# Patient Record
Sex: Female | Born: 1970 | Race: Black or African American | Hispanic: No | Marital: Single
Health system: Southern US, Community
[De-identification: ages and names within clinical notes are randomized; demographics above are authoritative.]

## PROBLEM LIST (undated history)

## (undated) DIAGNOSIS — R7881 Bacteremia: Secondary | ICD-10-CM

## (undated) DIAGNOSIS — M869 Osteomyelitis, unspecified: Secondary | ICD-10-CM

## (undated) DIAGNOSIS — G062 Extradural and subdural abscess, unspecified: Secondary | ICD-10-CM

## (undated) DIAGNOSIS — I959 Hypotension, unspecified: Secondary | ICD-10-CM

## (undated) DIAGNOSIS — F319 Bipolar disorder, unspecified: Secondary | ICD-10-CM

## (undated) DIAGNOSIS — L02212 Cutaneous abscess of back [any part, except buttock]: Secondary | ICD-10-CM

## (undated) DIAGNOSIS — Z930 Tracheostomy status: Secondary | ICD-10-CM

## (undated) DIAGNOSIS — G9341 Metabolic encephalopathy: Secondary | ICD-10-CM

## (undated) DIAGNOSIS — F419 Anxiety disorder, unspecified: Secondary | ICD-10-CM

## (undated) DIAGNOSIS — M4622 Osteomyelitis of vertebra, cervical region: Secondary | ICD-10-CM

## (undated) DIAGNOSIS — R0902 Hypoxemia: Secondary | ICD-10-CM

## (undated) DIAGNOSIS — I428 Other cardiomyopathies: Secondary | ICD-10-CM

## (undated) DIAGNOSIS — G8250 Quadriplegia, unspecified: Secondary | ICD-10-CM

## (undated) DIAGNOSIS — R1312 Dysphagia, oropharyngeal phase: Secondary | ICD-10-CM

## (undated) DIAGNOSIS — J9690 Respiratory failure, unspecified, unspecified whether with hypoxia or hypercapnia: Secondary | ICD-10-CM

---

## 2014-06-19 ENCOUNTER — Encounter (HOSPITAL_COMMUNITY): Payer: Self-pay | Admitting: Emergency Medicine

## 2014-06-19 ENCOUNTER — Inpatient Hospital Stay (HOSPITAL_COMMUNITY)
Admission: EM | Admit: 2014-06-19 | Discharge: 2014-07-26 | DRG: 870 | Disposition: A | Discharge status: Home Health | Payer: Medicare Other | Attending: Pulmonary Disease | Admitting: Pulmonary Disease

## 2014-06-19 ENCOUNTER — Emergency Department (HOSPITAL_COMMUNITY): Payer: Medicare Other

## 2014-06-19 DIAGNOSIS — A498 Other bacterial infections of unspecified site: Secondary | ICD-10-CM | POA: Insufficient documentation

## 2014-06-19 DIAGNOSIS — J9690 Respiratory failure, unspecified, unspecified whether with hypoxia or hypercapnia: Secondary | ICD-10-CM | POA: Insufficient documentation

## 2014-06-19 DIAGNOSIS — R739 Hyperglycemia, unspecified: Secondary | ICD-10-CM | POA: Diagnosis present

## 2014-06-19 DIAGNOSIS — R7881 Bacteremia: Secondary | ICD-10-CM | POA: Insufficient documentation

## 2014-06-19 DIAGNOSIS — Z930 Tracheostomy status: Secondary | ICD-10-CM

## 2014-06-19 DIAGNOSIS — Z6827 Body mass index (BMI) 27.0-27.9, adult: Secondary | ICD-10-CM

## 2014-06-19 DIAGNOSIS — D638 Anemia in other chronic diseases classified elsewhere: Secondary | ICD-10-CM | POA: Diagnosis present

## 2014-06-19 DIAGNOSIS — G9340 Encephalopathy, unspecified: Secondary | ICD-10-CM | POA: Diagnosis present

## 2014-06-19 DIAGNOSIS — I471 Supraventricular tachycardia: Secondary | ICD-10-CM | POA: Diagnosis present

## 2014-06-19 DIAGNOSIS — F419 Anxiety disorder, unspecified: Secondary | ICD-10-CM | POA: Diagnosis present

## 2014-06-19 DIAGNOSIS — I429 Cardiomyopathy, unspecified: Secondary | ICD-10-CM | POA: Diagnosis present

## 2014-06-19 DIAGNOSIS — J150 Pneumonia due to Klebsiella pneumoniae: Secondary | ICD-10-CM | POA: Diagnosis present

## 2014-06-19 DIAGNOSIS — J9600 Acute respiratory failure, unspecified whether with hypoxia or hypercapnia: Secondary | ICD-10-CM | POA: Insufficient documentation

## 2014-06-19 DIAGNOSIS — Y95 Nosocomial condition: Secondary | ICD-10-CM | POA: Diagnosis present

## 2014-06-19 DIAGNOSIS — A047 Enterocolitis due to Clostridium difficile: Secondary | ICD-10-CM | POA: Diagnosis not present

## 2014-06-19 DIAGNOSIS — E871 Hypo-osmolality and hyponatremia: Secondary | ICD-10-CM | POA: Diagnosis not present

## 2014-06-19 DIAGNOSIS — Z9911 Dependence on respirator [ventilator] status: Secondary | ICD-10-CM

## 2014-06-19 DIAGNOSIS — J9610 Chronic respiratory failure, unspecified whether with hypoxia or hypercapnia: Secondary | ICD-10-CM | POA: Insufficient documentation

## 2014-06-19 DIAGNOSIS — F319 Bipolar disorder, unspecified: Secondary | ICD-10-CM | POA: Diagnosis present

## 2014-06-19 DIAGNOSIS — N390 Urinary tract infection, site not specified: Secondary | ICD-10-CM | POA: Diagnosis present

## 2014-06-19 DIAGNOSIS — B952 Enterococcus as the cause of diseases classified elsewhere: Secondary | ICD-10-CM

## 2014-06-19 DIAGNOSIS — Z792 Long term (current) use of antibiotics: Secondary | ICD-10-CM

## 2014-06-19 DIAGNOSIS — E876 Hypokalemia: Secondary | ICD-10-CM | POA: Diagnosis not present

## 2014-06-19 DIAGNOSIS — J9620 Acute and chronic respiratory failure, unspecified whether with hypoxia or hypercapnia: Secondary | ICD-10-CM | POA: Diagnosis present

## 2014-06-19 DIAGNOSIS — M4622 Osteomyelitis of vertebra, cervical region: Secondary | ICD-10-CM | POA: Diagnosis present

## 2014-06-19 DIAGNOSIS — R509 Fever, unspecified: Secondary | ICD-10-CM | POA: Insufficient documentation

## 2014-06-19 DIAGNOSIS — R000 Tachycardia, unspecified: Secondary | ICD-10-CM | POA: Insufficient documentation

## 2014-06-19 DIAGNOSIS — G8250 Quadriplegia, unspecified: Secondary | ICD-10-CM | POA: Insufficient documentation

## 2014-06-19 DIAGNOSIS — J9612 Chronic respiratory failure with hypercapnia: Secondary | ICD-10-CM

## 2014-06-19 DIAGNOSIS — J9811 Atelectasis: Secondary | ICD-10-CM | POA: Insufficient documentation

## 2014-06-19 DIAGNOSIS — E46 Unspecified protein-calorie malnutrition: Secondary | ICD-10-CM | POA: Diagnosis present

## 2014-06-19 DIAGNOSIS — A4181 Sepsis due to Enterococcus: Secondary | ICD-10-CM | POA: Diagnosis present

## 2014-06-19 DIAGNOSIS — I5032 Chronic diastolic (congestive) heart failure: Secondary | ICD-10-CM | POA: Diagnosis present

## 2014-06-19 DIAGNOSIS — A419 Sepsis, unspecified organism: Secondary | ICD-10-CM | POA: Insufficient documentation

## 2014-06-19 DIAGNOSIS — B961 Klebsiella pneumoniae [K. pneumoniae] as the cause of diseases classified elsewhere: Secondary | ICD-10-CM | POA: Diagnosis present

## 2014-06-19 DIAGNOSIS — Z931 Gastrostomy status: Secondary | ICD-10-CM | POA: Diagnosis not present

## 2014-06-19 DIAGNOSIS — Z23 Encounter for immunization: Secondary | ICD-10-CM

## 2014-06-19 DIAGNOSIS — M4642 Discitis, unspecified, cervical region: Secondary | ICD-10-CM | POA: Diagnosis present

## 2014-06-19 DIAGNOSIS — A0472 Enterocolitis due to Clostridium difficile, not specified as recurrent: Secondary | ICD-10-CM | POA: Insufficient documentation

## 2014-06-19 DIAGNOSIS — R6520 Severe sepsis without septic shock: Secondary | ICD-10-CM | POA: Diagnosis present

## 2014-06-19 DIAGNOSIS — J189 Pneumonia, unspecified organism: Secondary | ICD-10-CM | POA: Insufficient documentation

## 2014-06-19 DIAGNOSIS — B370 Candidal stomatitis: Secondary | ICD-10-CM | POA: Diagnosis not present

## 2014-06-19 DIAGNOSIS — Z86718 Personal history of other venous thrombosis and embolism: Secondary | ICD-10-CM | POA: Diagnosis not present

## 2014-06-19 DIAGNOSIS — J1569 Pneumonia due to other gram-negative bacteria: Secondary | ICD-10-CM | POA: Insufficient documentation

## 2014-06-19 DIAGNOSIS — J156 Pneumonia due to other aerobic Gram-negative bacteria: Secondary | ICD-10-CM | POA: Insufficient documentation

## 2014-06-19 HISTORY — DX: Osteomyelitis of vertebra, cervical region: M46.22

## 2014-06-19 HISTORY — DX: Quadriplegia, unspecified: G82.50

## 2014-06-19 HISTORY — DX: Bipolar disorder, unspecified: F31.9

## 2014-06-19 HISTORY — DX: Extradural and subdural abscess, unspecified: G06.2

## 2014-06-19 HISTORY — DX: Dysphagia, oropharyngeal phase: R13.12

## 2014-06-19 HISTORY — DX: Hypoxemia: R09.02

## 2014-06-19 HISTORY — DX: Anxiety disorder, unspecified: F41.9

## 2014-06-19 HISTORY — DX: Respiratory failure, unspecified, unspecified whether with hypoxia or hypercapnia: J96.90

## 2014-06-19 HISTORY — DX: Osteomyelitis, unspecified: M86.9

## 2014-06-19 HISTORY — DX: Tracheostomy status: Z93.0

## 2014-06-19 HISTORY — DX: Hypotension, unspecified: I95.9

## 2014-06-19 HISTORY — DX: Other cardiomyopathies: I42.8

## 2014-06-19 HISTORY — DX: Bacteremia: R78.81

## 2014-06-19 HISTORY — DX: Cutaneous abscess of back (any part, except buttock): L02.212

## 2014-06-19 HISTORY — DX: Metabolic encephalopathy: G93.41

## 2014-06-19 LAB — COMPREHENSIVE METABOLIC PANEL
ALBUMIN: 2.8 g/dL — AB (ref 3.5–5.2)
ALT: 79 U/L — ABNORMAL HIGH (ref 0–35)
AST: 30 U/L (ref 0–37)
Alkaline Phosphatase: 96 U/L (ref 39–117)
Anion gap: 12 (ref 5–15)
BILIRUBIN TOTAL: 0.2 mg/dL — AB (ref 0.3–1.2)
BUN: 14 mg/dL (ref 6–23)
CALCIUM: 10.1 mg/dL (ref 8.4–10.5)
CHLORIDE: 90 meq/L — AB (ref 96–112)
CO2: 35 meq/L — AB (ref 19–32)
Creatinine, Ser: <0.2 mg/dL — ABNORMAL LOW (ref 0.50–1.10)
Glucose, Bld: 125 mg/dL — ABNORMAL HIGH (ref 70–99)
Potassium: 4.7 mEq/L (ref 3.7–5.3)
SODIUM: 137 meq/L (ref 137–147)
Total Protein: 7.7 g/dL (ref 6.0–8.3)

## 2014-06-19 LAB — CBC
HCT: 32.8 % — ABNORMAL LOW (ref 36.0–46.0)
Hemoglobin: 10.1 g/dL — ABNORMAL LOW (ref 12.0–15.0)
MCH: 30.1 pg (ref 26.0–34.0)
MCHC: 30.8 g/dL (ref 30.0–36.0)
MCV: 97.6 fL (ref 78.0–100.0)
PLATELETS: 309 10*3/uL (ref 150–400)
RBC: 3.36 MIL/uL — ABNORMAL LOW (ref 3.87–5.11)
RDW: 15 % (ref 11.5–15.5)
WBC: 15.2 10*3/uL — ABNORMAL HIGH (ref 4.0–10.5)

## 2014-06-19 LAB — URINALYSIS, ROUTINE W REFLEX MICROSCOPIC
BILIRUBIN URINE: NEGATIVE
GLUCOSE, UA: NEGATIVE mg/dL
Ketones, ur: 15 mg/dL — AB
Nitrite: POSITIVE — AB
PH: 7.5 (ref 5.0–8.0)
Protein, ur: >300 mg/dL — AB
Specific Gravity, Urine: 1.023 (ref 1.005–1.030)
UROBILINOGEN UA: 1 mg/dL (ref 0.0–1.0)

## 2014-06-19 LAB — URINE MICROSCOPIC-ADD ON

## 2014-06-19 LAB — CBC WITH DIFFERENTIAL/PLATELET
Basophils Absolute: 0 10*3/uL (ref 0.0–0.1)
Basophils Relative: 0 % (ref 0–1)
EOS PCT: 0 % (ref 0–5)
Eosinophils Absolute: 0.1 10*3/uL (ref 0.0–0.7)
HEMATOCRIT: 37.8 % (ref 36.0–46.0)
Hemoglobin: 11.6 g/dL — ABNORMAL LOW (ref 12.0–15.0)
LYMPHS ABS: 1.1 10*3/uL (ref 0.7–4.0)
LYMPHS PCT: 7 % — AB (ref 12–46)
MCH: 30.2 pg (ref 26.0–34.0)
MCHC: 30.7 g/dL (ref 30.0–36.0)
MCV: 98.4 fL (ref 78.0–100.0)
MONO ABS: 1.5 10*3/uL — AB (ref 0.1–1.0)
Monocytes Relative: 10 % (ref 3–12)
Neutro Abs: 12.7 10*3/uL — ABNORMAL HIGH (ref 1.7–7.7)
Neutrophils Relative %: 83 % — ABNORMAL HIGH (ref 43–77)
Platelets: 331 10*3/uL (ref 150–400)
RBC: 3.84 MIL/uL — AB (ref 3.87–5.11)
RDW: 15.1 % (ref 11.5–15.5)
WBC: 15.3 10*3/uL — AB (ref 4.0–10.5)

## 2014-06-19 LAB — CREATININE, SERUM: Creatinine, Ser: <0.2 mg/dL — ABNORMAL LOW (ref 0.50–1.10)

## 2014-06-19 LAB — I-STAT CG4 LACTIC ACID, ED: LACTIC ACID, VENOUS: 1.24 mmol/L (ref 0.5–2.2)

## 2014-06-19 LAB — TSH: TSH: 4.71 u[IU]/mL — AB (ref 0.350–4.500)

## 2014-06-19 LAB — I-STAT ARTERIAL BLOOD GAS, ED
Acid-Base Excess: 10 mmol/L — ABNORMAL HIGH (ref 0.0–2.0)
BICARBONATE: 37.1 meq/L — AB (ref 20.0–24.0)
O2 SAT: 95 %
PO2 ART: 80 mmHg (ref 80.0–100.0)
TCO2: 39 mmol/L (ref 0–100)
pCO2 arterial: 60.2 mmHg (ref 35.0–45.0)
pH, Arterial: 7.398 (ref 7.350–7.450)

## 2014-06-19 MED ORDER — SODIUM CHLORIDE 0.9 % IV BOLUS (SEPSIS)
1000.0000 mL | Freq: Once | INTRAVENOUS | Status: AC
  Administered 2014-06-19: 1000 mL via INTRAVENOUS

## 2014-06-19 MED ORDER — SODIUM CHLORIDE 0.9 % IV BOLUS (SEPSIS)
500.0000 mL | Freq: Once | INTRAVENOUS | Status: AC
  Administered 2014-06-19: 500 mL via INTRAVENOUS

## 2014-06-19 MED ORDER — FLUCONAZOLE IN SODIUM CHLORIDE 200-0.9 MG/100ML-% IV SOLN
200.0000 mg | INTRAVENOUS | Status: DC
  Administered 2014-06-20: 200 mg via INTRAVENOUS
  Filled 2014-06-19: qty 100

## 2014-06-19 MED ORDER — HEPARIN SODIUM (PORCINE) 5000 UNIT/ML IJ SOLN
5000.0000 [IU] | Freq: Three times a day (TID) | INTRAMUSCULAR | Status: DC
  Administered 2014-06-20 – 2014-07-14 (×74): 5000 [IU] via SUBCUTANEOUS
  Filled 2014-06-19 (×77): qty 1

## 2014-06-19 MED ORDER — SODIUM CHLORIDE 0.9 % IV SOLN
250.0000 mL | INTRAVENOUS | Status: DC | PRN
  Administered 2014-06-25: 250 mL via INTRAVENOUS
  Administered 2014-07-19: 500 mL via INTRAVENOUS

## 2014-06-19 MED ORDER — FAMOTIDINE IN NACL 20-0.9 MG/50ML-% IV SOLN
20.0000 mg | Freq: Two times a day (BID) | INTRAVENOUS | Status: DC
  Administered 2014-06-20 (×2): 20 mg via INTRAVENOUS
  Filled 2014-06-19 (×3): qty 50

## 2014-06-19 MED ORDER — ACETAMINOPHEN 160 MG/5ML PO SOLN
650.0000 mg | Freq: Once | ORAL | Status: AC
  Administered 2014-06-19: 650 mg
  Filled 2014-06-19: qty 20.3

## 2014-06-19 MED ORDER — SODIUM CHLORIDE 0.9 % IV SOLN
INTRAVENOUS | Status: DC
  Administered 2014-06-20 (×2): via INTRAVENOUS

## 2014-06-19 NOTE — ED Notes (Signed)
Admitting MD at bedside.

## 2014-06-19 NOTE — ED Notes (Signed)
Pt sent to ED [facility].  Ref. Tachycardia .  [facility] pt was transported to ED at pt's husband request ref fast heartbeat.  Staff at [facility] reported that pt always has a rapid heartbeat and has elevated temp off and on.  Pt is non-verbal and is a quad
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
## 2014-06-19 NOTE — ED Notes (Signed)
Critical Care PA in to assess pt for admission

## 2014-06-19 NOTE — ED Notes (Signed)
Pt's husband at bedside.  Resp. Tech called to suction pt.

## 2014-06-20 ENCOUNTER — Inpatient Hospital Stay (HOSPITAL_COMMUNITY): Payer: Medicare Other

## 2014-06-20 DIAGNOSIS — Z930 Tracheostomy status: Secondary | ICD-10-CM

## 2014-06-20 DIAGNOSIS — I471 Supraventricular tachycardia: Secondary | ICD-10-CM

## 2014-06-20 DIAGNOSIS — J9610 Chronic respiratory failure, unspecified whether with hypoxia or hypercapnia: Secondary | ICD-10-CM

## 2014-06-20 DIAGNOSIS — J189 Pneumonia, unspecified organism: Secondary | ICD-10-CM

## 2014-06-20 DIAGNOSIS — N390 Urinary tract infection, site not specified: Secondary | ICD-10-CM

## 2014-06-20 LAB — GLUCOSE, CAPILLARY
GLUCOSE-CAPILLARY: 118 mg/dL — AB (ref 70–99)
Glucose-Capillary: 99 mg/dL (ref 70–99)
Glucose-Capillary: 111 mg/dL — ABNORMAL HIGH (ref 70–99)
Glucose-Capillary: 94 mg/dL (ref 70–99)

## 2014-06-20 LAB — BASIC METABOLIC PANEL
Anion gap: 6 (ref 5–15)
BUN: 8 mg/dL (ref 6–23)
CO2: 31 mmol/L (ref 19–32)
Calcium: 8.9 mg/dL (ref 8.4–10.5)
Chloride: 98 mEq/L (ref 96–112)
Glucose, Bld: 106 mg/dL — ABNORMAL HIGH (ref 70–99)
Potassium: 4 mmol/L (ref 3.5–5.1)
Sodium: 135 mmol/L (ref 135–145)

## 2014-06-20 LAB — CBC
HCT: 31.7 % — ABNORMAL LOW (ref 36.0–46.0)
Hemoglobin: 9.7 g/dL — ABNORMAL LOW (ref 12.0–15.0)
MCH: 29.7 pg (ref 26.0–34.0)
MCHC: 30.6 g/dL (ref 30.0–36.0)
MCV: 96.9 fL (ref 78.0–100.0)
Platelets: 273 10*3/uL (ref 150–400)
RBC: 3.27 MIL/uL — ABNORMAL LOW (ref 3.87–5.11)
RDW: 15 % (ref 11.5–15.5)
WBC: 13.8 10*3/uL — ABNORMAL HIGH (ref 4.0–10.5)

## 2014-06-20 LAB — PHOSPHORUS: Phosphorus: 3.8 mg/dL (ref 2.3–4.6)

## 2014-06-20 LAB — MAGNESIUM: Magnesium: 1.6 mg/dL (ref 1.5–2.5)

## 2014-06-20 LAB — MRSA PCR SCREENING: MRSA by PCR: NEGATIVE

## 2014-06-20 MED ORDER — CHLORHEXIDINE GLUCONATE 0.12 % MT SOLN
15.0000 mL | Freq: Two times a day (BID) | OROMUCOSAL | Status: DC
  Administered 2014-06-20 – 2014-07-26 (×75): 15 mL via OROMUCOSAL
  Filled 2014-06-20 (×74): qty 15

## 2014-06-20 MED ORDER — MIDODRINE HCL 5 MG PO TABS
10.0000 mg | ORAL_TABLET | Freq: Three times a day (TID) | ORAL | Status: DC
  Administered 2014-06-20 – 2014-07-07 (×52): 10 mg
  Filled 2014-06-20 (×57): qty 2

## 2014-06-20 MED ORDER — VITAL HIGH PROTEIN PO LIQD
1000.0000 mL | ORAL | Status: DC
  Filled 2014-06-20 (×3): qty 1000

## 2014-06-20 MED ORDER — PRO-STAT SUGAR FREE PO LIQD
30.0000 mL | Freq: Two times a day (BID) | ORAL | Status: AC
  Administered 2014-06-20 (×2): 30 mL
  Filled 2014-06-20 (×2): qty 30

## 2014-06-20 MED ORDER — FREE WATER
100.0000 mL | Freq: Three times a day (TID) | Status: DC
  Administered 2014-06-20 – 2014-06-21 (×4): 100 mL

## 2014-06-20 MED ORDER — FAMOTIDINE 40 MG/5ML PO SUSR
20.0000 mg | Freq: Two times a day (BID) | ORAL | Status: DC
  Administered 2014-06-20 – 2014-07-04 (×29): 20 mg
  Filled 2014-06-20 (×30): qty 2.5

## 2014-06-20 MED ORDER — ACETAMINOPHEN 160 MG/5ML PO SOLN
650.0000 mg | Freq: Once | ORAL | Status: AC
  Administered 2014-06-20: 650 mg
  Filled 2014-06-20: qty 20.3

## 2014-06-20 MED ORDER — VITAL AF 1.2 CAL PO LIQD
1000.0000 mL | ORAL | Status: DC
  Administered 2014-06-20 – 2014-07-03 (×14): 1000 mL
  Filled 2014-06-20 (×22): qty 1000

## 2014-06-20 MED ORDER — CIPROFLOXACIN IN D5W 400 MG/200ML IV SOLN
400.0000 mg | Freq: Two times a day (BID) | INTRAVENOUS | Status: DC
  Administered 2014-06-20 – 2014-06-23 (×7): 400 mg via INTRAVENOUS
  Filled 2014-06-20 (×8): qty 200

## 2014-06-20 MED ORDER — METOPROLOL TARTRATE 25 MG PO TABS
25.0000 mg | ORAL_TABLET | Freq: Three times a day (TID) | ORAL | Status: DC
  Administered 2014-06-20 – 2014-07-07 (×46): 25 mg
  Filled 2014-06-20 (×54): qty 1

## 2014-06-20 MED ORDER — INSULIN ASPART 100 UNIT/ML ~~LOC~~ SOLN: 0.0000 [IU] | SUBCUTANEOUS | Status: DC

## 2014-06-20 MED ORDER — DICLOXACILLIN SODIUM 500 MG PO CAPS
500.0000 mg | ORAL_CAPSULE | Freq: Three times a day (TID) | ORAL | Status: DC
  Administered 2014-06-20 – 2014-07-26 (×111): 500 mg
  Filled 2014-06-20 (×113): qty 1

## 2014-06-20 MED ORDER — CETYLPYRIDINIUM CHLORIDE 0.05 % MT LIQD
7.0000 mL | Freq: Four times a day (QID) | OROMUCOSAL | Status: DC
  Administered 2014-06-20 – 2014-07-26 (×147): 7 mL via OROMUCOSAL

## 2014-06-20 MED ORDER — FLUCONAZOLE 40 MG/ML PO SUSR
200.0000 mg | Freq: Every day | ORAL | Status: DC
Start: 2014-06-20 — End: 2014-06-21
  Administered 2014-06-20 – 2014-06-21 (×2): 200 mg
  Filled 2014-06-20 (×2): qty 5

## 2014-06-20 MED ORDER — METOPROLOL TARTRATE 1 MG/ML IV SOLN
2.5000 mg | INTRAVENOUS | Status: DC | PRN
  Administered 2014-06-20 – 2014-06-22 (×2): 5 mg via INTRAVENOUS
  Administered 2014-06-25 – 2014-06-26 (×3): 2.5 mg via INTRAVENOUS
  Administered 2014-06-26: 5 mg via INTRAVENOUS
  Administered 2014-06-30 – 2014-07-05 (×2): 2.5 mg via INTRAVENOUS
  Administered 2014-07-06: 5 mg via INTRAVENOUS
  Administered 2014-07-06 – 2014-07-07 (×3): 2.5 mg via INTRAVENOUS
  Filled 2014-06-20 (×12): qty 5

## 2014-06-20 NOTE — Progress Notes (Signed)
ANTIBIOTIC CONSULT NOTE - INITIAL  Pharmacy Consult:  Cipro Indication:  Rule out UTI  Allergies  Allergen Reactions  . Tape Rash    Patient Measurements: Weight: 138 lb 10.7 oz (62.9 kg)  Vital Signs: Temp: 100.7 F (38.2 C) (12/22 0814) Temp Source: Oral (12/22 0814) BP: 118/61 mmHg (12/22 0840) Pulse Rate: 128 (12/22 0840) Intake/Output from previous day: 12/21 0701 - 12/22 0700 In: 706.7 [I.V.:496.7; IV Piggyback:150] Out: 550 [Urine:550] Intake/Output from this shift: Total I/O In: 170 [I.V.:110; Other:60] Out: 75 [Urine:75]  Labs:  Recent Labs  06/19/14 2014 06/19/14 2225 06/20/14 0330  WBC 15.3* 15.2* 13.8*  HGB 11.6* 10.1* 9.7*  PLT 331 309 273  CREATININE <0.20* <0.20* <0.30*   CrCl cannot be calculated (Unknown ideal weight.). No results for input(s): VANCOTROUGH, VANCOPEAK, VANCORANDOM, GENTTROUGH, GENTPEAK, GENTRANDOM, TOBRATROUGH, TOBRAPEAK, TOBRARND, AMIKACINPEAK, AMIKACINTROU, AMIKACIN in the last 72 hours.   Microbiology: Recent Results (from the past 720 hour(s))  MRSA PCR Screening     Status: None   Collection Time: 06/20/14 12:20 AM  Result Value Ref Range Status   MRSA by PCR NEGATIVE NEGATIVE Final    Comment:        The GeneXpert MRSA Assay (FDA approved for NASAL specimens only), is one component of a comprehensive MRSA colonization surveillance program. It is not intended to diagnose MRSA infection nor to guide or monitor treatment for MRSA infections.     Medical History: Past Medical History  Diagnosis Date  . Osteomyelitis   . Epidural abscess   . Respiratory failure   . Metabolic encephalopathy   . Nonischemic cardiomyopathy   . Hypotension   . Osteomyelitis of vertebra of cervical region   . Bipolar 1 disorder   . Anxiety disorder   . Hypoxemia   . Bacteremia   . Hypoxic ischemic encephalopathy (HIE)   . Cutaneous abscess of back excluding buttocks   . Quadriplegia   . Dysphagia, oropharyngeal phase   .  Tracheostomy in place      Assessment: [age] YOF admitted from [facility] for evaluation of tachycardia.  She has a complicated infectious history and is continued on dicloxacillin from PTA for history of cervical hardware/osteomyelitis.  Pharmacy consulted to initiate Cipro for UTI.  Order was missed.  Patient is quadriplegic; therefore, her SCr is likely falsely low.  Cipro 12/22 >> Dicloxacillin PTA 12/15 >> 08/14/14 Fluc 12/22 >>  12/22 TA cx - 12/21 BCx x2 -  12/21 UCx -    Goal of Therapy:  Clearance of infection   Plan:  - Cipro 400mg  IV Q12H - Diflucan 200mg  IV Q24H and dicloxacillin 500mg  PT TID per MD - Monitor renal fxn, clinical progress - Consider checking free T3/T4   Cory Kitt D. Laney Potashang, PharmD, BCPS Pager:  [phone] 06/20/2014, 9:18 AM

## 2014-06-20 NOTE — H&P (Signed)
PULMONARY / CRITICAL CARE MEDICINE   Name: Kathryn Booth MRN: [id number] DOB: [birth year]    ADMISSION DATE:  06/19/2014 CONSULTATION DATE:  06/20/2014  REFERRING MD :  [facility]  CHIEF COMPLAINT:  Tachycardia  INITIAL PRESENTATION:  43 y.o. 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  Recently admitted to [facility] (06/07/14).  Has had persistent tachycardia while at [facility].  [facility] after husbands request for further evaluation of tachycardia.  In ED, UA suggestive of UTI.   STUDIES:  None  SIGNIFICANT EVENTS: 04/10/14 - debridement of cervical abscess [facility] 06/07/14 - admitted to [facility] 12/21 - [facility]   HISTORY OF PRESENT ILLNESS:  Pt is encephalopathic; therefore, this HPI is obtained from chart review. Kathryn Booth is a 43 y.o. F with PMH as outlined below.  [facility] with complaints of neck pain and was found to have cervical abscess for which she underwent debridement on 10/15.  She is now quadriplegic secondary to her cervical abscess.  Her hospital course was complicated by development of shock and multiple organ failure requiring pressors.  [facility] for attempts at weaning from ventilator; however, she was unable to do so secondary to quadriplegia.  She subsequently required trach and PEG She was admitted to [facility] on 06/07/14 and per report, had been tachycardic since then.  On 12/21, pt's [facility] for further evaluation of tachycardia.  Of note, pt is on dicloxacillin 500mg  TID until 08/14/14 for cervical hardware.   PAST MEDICAL HISTORY :   has a past medical history of Osteomyelitis; Epidural abscess; Respiratory failure; Metabolic encephalopathy; Nonischemic cardiomyopathy; Hypotension; Osteomyelitis of vertebra of cervical region; Bipolar 1 disorder; Anxiety disorder; Hypoxemia; Bacteremia; Hypoxic ischemic encephalopathy  (HIE); Cutaneous abscess of back excluding buttocks; Quadriplegia; Dysphagia, oropharyngeal phase; and Tracheostomy in place.  has no past surgical history on file. Prior to Admission medications   Medication Sig Start Date End Date Taking? Authorizing Provider  acetaminophen (TYLENOL) 325 MG tablet 650 mg by PEG Tube route every 6 (six) hours as needed for mild pain.   Yes Historical Provider, MD  chlorhexidine (PERIDEX) 0.12 % solution Use as directed 15 mLs in the mouth or throat 2 (two) times daily.   Yes Historical Provider, MD  dicloxacillin (DYNAPEN) 500 MG capsule 500 mg by PEG Tube route 2 (two) times daily.   Yes Historical Provider, MD  dicloxacillin (DYNAPEN) 500 MG capsule Take 500 mg by mouth 3 (three) times daily.   Yes Historical Provider, MD  diltiazem (CARDIZEM) 30 MG tablet Place 30 mg into feeding tube every 6 (six) hours as needed (> 120).   Yes Historical Provider, MD  diltiazem (CARDIZEM) 30 MG tablet 30 mg by PEG Tube route daily.   Yes Historical Provider, MD  famotidine (PEPCID) 20 MG tablet 20 mg by PEG Tube route at bedtime.   Yes Historical Provider, MD  fentaNYL (DURAGESIC - DOSED MCG/HR) 25 MCG/HR patch Place 25 mcg onto the skin every 3 (three) days.   Yes Historical Provider, MD  heparin 5000 UNIT/ML injection Inject 5,000 Units into the skin every 8 (eight) hours.   Yes Historical Provider, MD  insulin lispro (HUMALOG) 100 UNIT/ML injection Inject 0-8 Units into the skin 3 (three) times daily before meals. Patient uses sliding scale   Yes Historical Provider, MD  metoprolol tartrate (LOPRESSOR) 25 MG tablet 25 mg by  PEG Tube route every 8 (eight) hours.   Yes Historical Provider, MD  midodrine (PROAMATINE) 10 MG tablet 10 mg by PEG Tube route 3 (three) times daily.   Yes Historical Provider, MD  Nutritional Supplements (ISOSOURCE 1.5 CAL PO) 300 mLs by PEG Tube route every 6 (six) hours.   Yes Historical Provider, MD  polyvinyl alcohol (LIQUIFILM TEARS) 1.4 %  ophthalmic solution Place 1 drop into both eyes as needed for dry eyes.   Yes Historical Provider, MD  potassium chloride (KLOR-CON) 20 MEQ packet 20 mEq by PEG Tube route daily.   Yes Historical Provider, MD  senna (SENOKOT) 8.6 MG tablet 2 tablets by PEG Tube route daily.   Yes Historical Provider, MD   Allergies  Allergen Reactions  . Tape Rash    FAMILY HISTORY:  No family history on file.  SOCIAL HISTORY:  reports that she does not drink alcohol or use illicit drugs. Her tobacco history is not on file.  REVIEW OF SYSTEMS:  Unable to obtain as pt is non-verbal.  SUBJECTIVE:   VITAL SIGNS: Temp:  [99.5 F (37.5 C)] 99.5 F (37.5 C) (12/22 0033) Pulse Rate:  [129-142] 132 (12/22 0130) Resp:  [16-30] 24 (12/22 0135) BP: (83-116)/(42-71) 116/71 mmHg (12/22 0135) SpO2:  [95 %-100 %] 99 % (12/22 0135) FiO2 (%):  [30 %] 30 % (12/22 0135) Weight:  [62.9 kg (138 lb 10.7 oz)] 62.9 kg (138 lb 10.7 oz) (12/22 0113) HEMODYNAMICS:   VENTILATOR SETTINGS: Vent Mode:  [-] SIMV;PSV FiO2 (%):  [30 %] 30 % Set Rate:  [12 bmp] 12 bmp Vt Set:  [450 mL] 450 mL PEEP:  [5 cmH20] 5 cmH20 Pressure Support:  [10 cmH20] 10 cmH20 Plateau Pressure:  [26 cmH20-27 cmH20] 26 cmH20 INTAKE / OUTPUT: Intake/Output      12/21 0701 - 12/22 0700   IV Piggyback 100   Total Intake(mL/kg) 100 (1.6)   Net +100         PHYSICAL EXAMINATION: General: WDWN female, in NAD. Neuro: Non-verbal at baseline.  Opens eyes and shakes head. HEENT: [place]/AT. PERRL, sclerae anicteric. Cardiovascular: Tachycardic, regular, no M/R/G.  Lungs: Respirations even and unlabored.  CTA bilaterally, No W/R/R.  Abdomen: BS x 4, soft, NT/ND.  Musculoskeletal: No gross deformities, no edema.  Skin: Intact, warm, no rashes.  LABS:  CBC  Recent Labs Lab 06/19/14 2014 06/19/14 2225  WBC 15.3* 15.2*  HGB 11.6* 10.1*  HCT 37.8 32.8*  PLT 331 309   Coag's No results for input(s): APTT, INR in the last 168  hours. BMET  Recent Labs Lab 06/19/14 2014 06/19/14 2225  NA 137  --   K 4.7  --   CL 90*  --   CO2 35*  --   BUN 14  --   CREATININE <0.20* <0.20*  GLUCOSE 125*  --    Electrolytes  Recent Labs Lab 06/19/14 2014  CALCIUM 10.1   Sepsis Markers  Recent Labs Lab 06/19/14 2022  LATICACIDVEN 1.24   ABG  Recent Labs Lab 06/19/14 2035  PHART 7.398  PCO2ART 60.2*  PO2ART 80.0   Liver Enzymes  Recent Labs Lab 06/19/14 2014  AST 30  ALT 79*  ALKPHOS 96  BILITOT 0.2*  ALBUMIN 2.8*   Cardiac Enzymes No results for input(s): TROPONINI, PROBNP in the last 168 hours. Glucose  Recent Labs Lab 06/20/14 0034  GLUCAP 118*    Imaging Dg Chest Port 1 View  06/19/2014   CLINICAL DATA:  Fever. History  of spinal cord abscess last year. Initial encounter.  EXAM: PORTABLE CHEST - 1 VIEW  COMPARISON:  None.  FINDINGS: 1950 hr. Tracheostomy tube tip is in the midtrachea. Right arm PICC is not seen inferior to the upper SVC. A metallic stent overlies the left hilum. There is volume loss in the left hemithorax with asymmetric retrocardiac density. There may be a small left pleural effusion versus pleural thickening. The right lung is clear.  IMPRESSION: Left basilar pulmonary opacity with volume loss. Without prior examinations, the chronicity of this finding is unclear. Pneumonia cannot be excluded.  Support system positioned as above.   Electronically Signed   By: Roxy Horseman M.D.   On: 06/19/2014 20:28    ASSESSMENT / PLAN:  PULMONARY Trach ? Date >>> A: VDRF Tracheostomy status P:   Full mechanical support, wean as able. VAP bundle. CXR in AM.  CARDIOVASCULAR PICC RUE ? Date >>> A:  Tachycardia Hx Hypotension - on midodrine dCHF - EF 50%, unknown date P:  Check TSH. Continue outpatient midodrine. Hold outpatient atenolol, lisinopril, metoprolol. Consider echo.  RENAL A:   No acute issues P:   NS @ 100. BMP in AM.  GASTROINTESTINAL A:   GI  prophylaxis Nutrition P:   SUP: Famotidine. NPO. Nutrition consult for TF's.  HEMATOLOGIC A:   Hx UE DVT - not on anticoagulation VTE Prophylaxis P:  SCD's / Heparin. CBC in AM.  INFECTIOUS A:   UTI Hx Funguria Osteomyelitis and Diskitis - on dicloxacillin through 08/14/14 P:   BCx2 12/21 >>> UCx 12/21 >>> Sputum Cx 12/21 >>> Abx:  Cipro , start date 12/21, day 1/x. Abx: Diflucan, start date 12/21, day 1/x. Abx: Dicloxacillin, start date 12/15, scheduled through 08/14/14.  ENDOCRINE A:   ? DM - on insulin as outpatient P:   Continue SSI.  NEUROLOGIC A:   Quadriplegia ? Chronic encephalopathy - unclear etiology.  Multiple evaluations by neuro, possibly cerebral insult due to hyperthermic brain injury vs hypoperfusion vs septic encephalopathy.  Prior EEG's not c/w locked in syndrome. P:   Monitor clinically.   Family updated: Husband on phone.  Interdisciplinary Family Meeting v Palliative Care Meeting:  Due by: 12/28.   Rutherford Guys, PA - [facility] Pgr: [phone]  or [phone]  ATTENDING NOTE: I have personally reviewed patient's available data, including medical history, events of note, physical examination and test results as part of my evaluation. I have discussed with resident/NP and other careteam providers such as pharmacist, RN and RRT & co-ordinated with consultants. In addition, I personally evaluated patient and elicited key history of quadriplegia due to cervical epidural abscess now vent dependent, on chronic antibioticswith persistent tachycardia,  exam findings of quad, poor mental status, mild hypotension, tachy 130's sinus & labs showing compensated resp acidosis,mild leucocytosis, anemia.  Impression - r/o sepsis as a cause of persistent tachy-possible sources incl funguria, PICC line -pan culture , if all negative - then consider autonomic causes & resume beta blockade Rest per NP whose note is  outlined above and that I agree with and edited in full.   Updated husband at bedside in full.  Care during the described time interval was provided by me and/or other providers on the critical care team.  I have reviewed this patient's available data, including medical history, events of note, physical examination and test results as part of my evaluation  CC time x 50 mins - reflects care  rendered from11-10-1153 pm  Cyril Mourningakesh Carnell Beavers MD. [facility]. [facility] Pager [phone] If no response call [phone]     06/20/2014, 1:56 AM

## 2014-06-20 NOTE — Progress Notes (Signed)
INITIAL NUTRITION ASSESSMENT  DOCUMENTATION CODES Per approved criteria  -Not Applicable   INTERVENTION:  Initiate TF via PEG with Vital AF 1.2 at 25 ml/h and Prostat 30 ml BID on day 1; on day 2, d/c Prostat and increase to goal rate of 55 ml/h (1320 ml per day) to provide 1584 kcals, 99 gm protein, 1071 ml free water daily.  NUTRITION DIAGNOSIS: Inadequate oral intake related to inability to eat as evidenced by NPO status.   Goal: Intake to meet >90% of estimated nutrition needs.  Monitor:  TF tolerance/adequacy, weight trend, labs, vent status.  Reason for Assessment: MD Consult for TF initiation and management.  43 y.o. female  Admitting Dx: Tacchycardia  ASSESSMENT: 43 y.o. 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 Recently admitted to [facility] (06/07/14). Has had persistent tachycardia while at [facility]. [facility] after husbands request for further evaluation of tachycardia. In ED, UA suggestive of UTI.  Patient has a PEG and tracheostomy. Received Isosource 1.5 300 ml QID PTA.  Patient is currently intubated on ventilator support MV: 7.7 L/min Temp (24hrs), Avg:100.3 F (37.9 C), Min:99.5 F (37.5 C), Max:101 F (38.3 C)  Propofol: none  Height: Ht Readings from Last 1 Encounters:  06/20/14 5' (1.524 m)    Weight: Wt Readings from Last 1 Encounters:  06/20/14 138 lb 10.7 oz (62.9 kg)    Ideal Body Weight: 45.5 kg  % Ideal Body Weight: 138%  Wt Readings from Last 10 Encounters:  06/20/14 138 lb 10.7 oz (62.9 kg)    Usual Body Weight: unknown  % Usual Body Weight: N/A  BMI:  Body mass index is 27.08 kg/(m^2).  Estimated Nutritional Needs: Kcal: 1592 Protein: 85-100 gm Fluid: 1.6-1.8 L  Skin: intact  Diet Order: Diet NPO time specified  EDUCATION NEEDS: -Education not appropriate at this time   Intake/Output Summary (Last 24 hours) at 06/20/14 1304 Last data filed at 06/20/14 1200  Gross per 24  hour  Intake   1535 ml  Output    875 ml  Net    660 ml    Last BM: 12/22   Labs:   Recent Labs Lab 06/19/14 2014 06/19/14 2225 06/20/14 0330  NA 137  --  135  K 4.7  --  4.0  CL 90*  --  98  CO2 35*  --  31  BUN 14  --  8  CREATININE <0.20* <0.20* <0.30*  CALCIUM 10.1  --  8.9  MG  --   --  1.6  PHOS  --   --  3.8  GLUCOSE 125*  --  106*    CBG (last 3)   Recent Labs  06/20/14 0034 06/20/14 0333 06/20/14 0812  GLUCAP 118* 99 111*    Scheduled Meds: . antiseptic oral rinse  7 mL Mouth Rinse QID  . chlorhexidine  15 mL Mouth Rinse BID  . ciprofloxacin  400 mg Intravenous Q12H  . dicloxacillin  500 mg Per Tube 3 times per day  . famotidine  20 mg Per Tube BID  . feeding supplement (PRO-STAT SUGAR FREE 64)  30 mL Per Tube BID  . feeding supplement (VITAL HIGH PROTEIN)  1,000 mL Per Tube Q24H  . fluconazole  200 mg Per Tube Daily  . free water  100 mL Per Tube 3 times per day  . heparin  5,000 Units Subcutaneous 3 times per day  . metoprolol tartrate  25 mg Per Tube Q8H  .  midodrine  10 mg Per Tube TID WC    Continuous Infusions: . sodium chloride 50 mL/hr at 06/20/14 [id number]    Past Medical History  Diagnosis Date  . Osteomyelitis   . Epidural abscess   . Respiratory failure   . Metabolic encephalopathy   . Nonischemic cardiomyopathy   . Hypotension   . Osteomyelitis of vertebra of cervical region   . Bipolar 1 disorder   . Anxiety disorder   . Hypoxemia   . Bacteremia   . Hypoxic ischemic encephalopathy (HIE)   . Cutaneous abscess of back excluding buttocks   . Quadriplegia   . Dysphagia, oropharyngeal phase   . Tracheostomy in place     History reviewed. No pertinent past surgical history.   Joaquin CourtsKimberly Saachi Zale, RD, LDN, CNSC Pager [phone] After Hours Pager [phone]

## 2014-06-20 NOTE — Progress Notes (Signed)
UR Completed.  [phone]  

## 2014-06-20 NOTE — Progress Notes (Signed)
PULMONARY / CRITICAL CARE MEDICINE   Name: Kathryn Booth MRN: [id number] DOB: [birth year]    ADMISSION DATE:  06/19/2014 CONSULTATION DATE:  06/20/2014  REFERRING MD :  [facility]  CHIEF COMPLAINT:  Tachycardia  INITIAL PRESENTATION:  43 y.o. 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  Recently admitted to [facility] (06/07/14).  Has had persistent tachycardia while at [facility].  [facility] after husbands request for further evaluation of tachycardia.  In ED, UA suggestive of UTI.   STUDIES:    SIGNIFICANT EVENTS: 10/12 debridement of cervical abscess [facility] 12/09 [facility] to [facility]x as above    SUBJECTIVE:  Minimally responsive. No overt distress  VITAL SIGNS: Temp:  [99.5 F (37.5 C)-101.3 F (38.5 C)] 101.3 F (38.5 C) (12/22 1300) Pulse Rate:  [107-142] 109 (12/22 1524) Resp:  [15-30] 26 (12/22 1524) BP: (83-118)/(42-74) 91/56 mmHg (12/22 1524) SpO2:  [95 %-100 %] 100 % (12/22 1524) FiO2 (%):  [30 %] 30 % (12/22 1529) Weight:  [62.9 kg (138 lb 10.7 oz)] 62.9 kg (138 lb 10.7 oz) (12/22 0113) HEMODYNAMICS:   VENTILATOR SETTINGS: Vent Mode:  [-] PRVC FiO2 (%):  [30 %] 30 % Set Rate:  [12 bmp-15 bmp] 15 bmp Vt Set:  [450 mL] 450 mL PEEP:  [5 cmH20] 5 cmH20 Pressure Support:  [10 cmH20] 10 cmH20 Plateau Pressure:  [24 cmH20-27 cmH20] 24 cmH20 INTAKE / OUTPUT: Intake/Output      12/21 0701 - 12/22 0700 12/22 0701 - 12/23 0700   I.V. (mL/kg) 496.7 (7.9) 488.3 (7.8)   Other 60 90   NG/GT  200   IV Piggyback 150 250   Total Intake(mL/kg) 706.7 (11.2) 1028.3 (16.3)   Urine (mL/kg/hr) 550 575 (1.1)   Total Output 550 575   Net +156.7 +453.3        Stool Occurrence 1 x      PHYSICAL EXAMINATION: General: NAD Neuro: Quad, not F/C HEENT: WNL, trach site clean Cardiovascular: tachy, reg, no M  Lungs: L>R rhonchi Abdomen: BS x 4, soft, NT/ND.  Ext: no edema.    LABS: I have reviewed all of today's  lab results. Relevant abnormalities are discussed in the A/P section  CXR: LLL consolidation and volume loss  ASSESSMENT / PLAN:  PULMONARY A: Prolonged VDRF Tracheostomy status HCAP P:   Cont full vent support - settings reviewed and/or adjusted Cont vent bundle No weaning  CARDIOVASCULAR PICC RUE ? Date >>> A:  Sinus tachycardia, likely reactive Hx Hypotension - on midodrine P:  Continue outpatient midodrine. Resume betablocker therapy Treat fever, discomfort  RENAL A:   No acute issues P:   Monitor BMET intermittently Monitor I/Os Correct electrolytes as indicated  GASTROINTESTINAL A:   Chronic G tube status P:   SUP: Famotidine. Begin TFs 12/22  HEMATOLOGIC A:   Hx UE DVT - not on anticoagulation P:  DVT px: SQ heparin Monitor CBC intermittently Transfuse per usual ICU guidelines  INFECTIOUS A:   LLL HCAP  Pyuria Hx Funguria Chronic cervical osteomyelitis and diskitis - on dicloxacillin through 08/14/14 P:   Resp cx 12/22 Micro and abx reviewed Cont Cipro, fluconazole and dicoxacllin  ENDOCRINE A:   Hyperglycemia P:   Continue SSI.  NEUROLOGIC A:   Quadriplegia ? Chronic encephalopathy - unclear etiology.  Multiple evaluations by neuro, possibly cerebral insult due to hyperthermic brain injury vs hypoperfusion vs septic encephalopathy.  Prior EEG's not c/w locked in syndrome. P:  Monitor clinically.  Anticipate transfer back to [facility] 12/23 CCM X 30 mins  Billy Fischeravid Simonds, MD ; [facility] Mobile [phone].  After 5:30 PM or weekends, call [phone]

## 2014-06-20 NOTE — Clinical Social Work Note (Signed)
Clinical Social Worker has assessed patient and pt's family. Full psychosocial to follow.   CSW continues to follow for discharge needs.   Derenda FennelBashira Stafford Riviera, MSW, LCSWA [phone] 06/20/2014 12:48 PM

## 2014-06-21 LAB — GLUCOSE, CAPILLARY
Glucose-Capillary: 116 mg/dL — ABNORMAL HIGH (ref 70–99)
Glucose-Capillary: 131 mg/dL — ABNORMAL HIGH (ref 70–99)
Glucose-Capillary: 146 mg/dL — ABNORMAL HIGH (ref 70–99)

## 2014-06-21 LAB — COMPREHENSIVE METABOLIC PANEL
ALK PHOS: 62 U/L (ref 39–117)
ALT: 66 U/L — AB (ref 0–35)
AST: 34 U/L (ref 0–37)
Albumin: 2.4 g/dL — ABNORMAL LOW (ref 3.5–5.2)
Anion gap: 8 (ref 5–15)
BUN: 10 mg/dL (ref 6–23)
CO2: 28 mmol/L (ref 19–32)
Calcium: 8.8 mg/dL (ref 8.4–10.5)
Chloride: 96 mEq/L (ref 96–112)
Creatinine, Ser: <0.3 mg/dL — ABNORMAL LOW (ref 0.50–1.10)
Glucose, Bld: 130 mg/dL — ABNORMAL HIGH (ref 70–99)
POTASSIUM: 2.5 mmol/L — AB (ref 3.5–5.1)
SODIUM: 132 mmol/L — AB (ref 135–145)
TOTAL PROTEIN: 5.8 g/dL — AB (ref 6.0–8.3)
Total Bilirubin: 0.6 mg/dL (ref 0.3–1.2)

## 2014-06-21 LAB — BASIC METABOLIC PANEL
Anion gap: 8 (ref 5–15)
BUN: 8 mg/dL (ref 6–23)
CHLORIDE: 102 meq/L (ref 96–112)
CO2: 26 mmol/L (ref 19–32)
Calcium: 9.5 mg/dL (ref 8.4–10.5)
Creatinine, Ser: <0.3 mg/dL — ABNORMAL LOW (ref 0.50–1.10)
GLUCOSE: 125 mg/dL — AB (ref 70–99)
POTASSIUM: 4.6 mmol/L (ref 3.5–5.1)
SODIUM: 136 mmol/L (ref 135–145)

## 2014-06-21 LAB — URINE CULTURE

## 2014-06-21 LAB — MAGNESIUM: MAGNESIUM: 1.7 mg/dL (ref 1.5–2.5)

## 2014-06-21 LAB — CBC
HCT: 28.6 % — ABNORMAL LOW (ref 36.0–46.0)
Hemoglobin: 9.1 g/dL — ABNORMAL LOW (ref 12.0–15.0)
MCH: 29.8 pg (ref 26.0–34.0)
MCHC: 31.8 g/dL (ref 30.0–36.0)
MCV: 93.8 fL (ref 78.0–100.0)
Platelets: 253 10*3/uL (ref 150–400)
RBC: 3.05 MIL/uL — ABNORMAL LOW (ref 3.87–5.11)
RDW: 14.7 % (ref 11.5–15.5)
WBC: 11.7 10*3/uL — ABNORMAL HIGH (ref 4.0–10.5)

## 2014-06-21 LAB — CULTURE, BLOOD (ROUTINE X 2)

## 2014-06-21 MED ORDER — POTASSIUM CHLORIDE 20 MEQ/15ML (10%) PO SOLN
40.0000 meq | ORAL | Status: AC
  Administered 2014-06-21 (×2): 40 meq
  Filled 2014-06-21 (×2): qty 30

## 2014-06-21 MED ORDER — ALTEPLASE 2 MG IJ SOLR
4.0000 mg | Freq: Once | INTRAMUSCULAR | Status: AC
  Administered 2014-06-22: 4 mg
  Filled 2014-06-21 (×2): qty 4

## 2014-06-21 MED ORDER — DEXTROSE 5 % IV SOLN
1.0000 g | Freq: Two times a day (BID) | INTRAVENOUS | Status: DC
  Filled 2014-06-21 (×2): qty 1

## 2014-06-21 MED ORDER — ALTEPLASE 2 MG IJ SOLR
2.0000 mg | Freq: Once | INTRAMUSCULAR | Status: AC
  Administered 2014-06-22: 2 mg
  Filled 2014-06-21: qty 2

## 2014-06-21 MED ORDER — POTASSIUM CHLORIDE 10 MEQ/50ML IV SOLN
10.0000 meq | INTRAVENOUS | Status: AC
  Administered 2014-06-21 (×4): 10 meq via INTRAVENOUS
  Filled 2014-06-21 (×2): qty 50

## 2014-06-21 MED ORDER — ALTEPLASE 2 MG IJ SOLR: 2.0000 mg | Freq: Once | INTRAMUSCULAR | Status: DC

## 2014-06-21 MED ORDER — CEFEPIME HCL 1 G IJ SOLR
1.0000 g | Freq: Three times a day (TID) | INTRAMUSCULAR | Status: DC
  Administered 2014-06-21 – 2014-06-22 (×3): 1 g via INTRAVENOUS
  Filled 2014-06-21 (×5): qty 1

## 2014-06-21 NOTE — Progress Notes (Signed)
PULMONARY / CRITICAL CARE MEDICINE   Name: Kathryn Booth MRN: [id number] DOB: 01/11/1971    ADMISSION DATE:  06/19/2014 CONSULTATION DATE:  06/21/2014  REFERRING MD :  [facility]  CHIEF COMPLAINT:  Tachycardia  INITIAL PRESENTATION:  43 y.o. 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  Recently admitted to [facility] (06/07/14).  Has had persistent tachycardia while at [facility].  [facility] after husbands request for further evaluation of tachycardia.  In ED, UA suggestive of UTI.   STUDIES/SIGNIFICANT EVENTS: 10/12 debridement of cervical abscess [facility] 12/09 [facility] as [facility]   INDWELLING DEVICES:: Trach (chronic) G tube (chronic) RUE PICC (chronic)  MICRO DATA: Urine 12/21 >> NEG Resp 12/22 >> abundant GNRs >>   ANTIMICROBIALS:  Dicloxacillin (long term for MSSA epidural abscess) >> 08/14/14 Fluconazole 12/21 >> 12/23 Cipro 12/21 >>  Cefepime 12/23 >>   SUBJECTIVE:  Minimally responsive. No overt distress  VITAL SIGNS: Temp:  [98.6 F (37 C)-100.5 F (38.1 C)] 99.6 F (37.6 C) (12/23 1148) Pulse Rate:  [98-119] 117 (12/23 1400) Resp:  [15-27] 16 (12/23 1300) BP: (88-121)/(48-69) 95/48 mmHg (12/23 1400) SpO2:  [98 %-100 %] 100 % (12/23 1400) FiO2 (%):  [3 %-30 %] 30 % (12/23 1123) Weight:  [63.9 kg (140 lb 14 oz)] 63.9 kg (140 lb 14 oz) (12/23 0500) HEMODYNAMICS:   VENTILATOR SETTINGS: Vent Mode:  [-] PRVC FiO2 (%):  [3 %-30 %] 30 % Set Rate:  [15 bmp] 15 bmp Vt Set:  [450 mL] 450 mL PEEP:  [5 cmH20] 5 cmH20 Plateau Pressure:  [22 cmH20-28 cmH20] 28 cmH20 INTAKE / OUTPUT: Intake/Output      12/22 0701 - 12/23 0700 12/23 0701 - 12/24 0700   I.V. (mL/kg) 1468.3 (23) 70 (1.1)   Other 90    NG/GT 734.2 385   IV Piggyback 500 350   Total Intake(mL/kg) 2792.5 (43.7) 805 (12.6)   Urine (mL/kg/hr) 1635 (1.1) 735 (1.5)   Total Output 1635 735   Net +1157.5 +70        Stool Occurrence 4 x       PHYSICAL EXAMINATION: General: NAD Neuro: Quad, not F/C HEENT: WNL, trach site clean Cardiovascular: tachy, reg, no M  Lungs: L>R rhonchi Abdomen: BS x 4, soft, NT/ND.  Ext: no edema.    LABS: I have reviewed all of today's lab results. Relevant abnormalities are discussed in the A/P section  CXR: NNF  ASSESSMENT / PLAN:  PULMONARY A: Prolonged VDRF Tracheostomy status HCAP P:   Cont full vent support - settings reviewed and/or adjusted Cont vent bundle No weaning  CARDIOVASCULAR A:  Sinus tachycardia, likely reactive Chronic hypotension - on midodrine P:  Continue midodrine. Cont beta blocker  Treat fever, discomfort  RENAL A:   No acute issues P:   Monitor BMET intermittently Monitor I/Os Correct electrolytes as indicated  GASTROINTESTINAL A:   Chronic G tube status P:   SUP: Famotidine. Cont TFs  HEMATOLOGIC A:   Hx UE DVT - not on anticoagulation P:  DVT px: SQ heparin Monitor CBC intermittently Transfuse per usual ICU guidelines  INFECTIOUS A:   LLL HCAP - GNR Chronic cervical osteomyelitis and diskitis - on dicloxacillin through 08/14/14 P:   Micro and abx as above  ENDOCRINE A:   Hyperglycemia P:   Continue SSI.  NEUROLOGIC A:   Quadriplegia ? Chronic encephalopathy - unclear etiology.   P:   Monitor clinically.  Husband refuses transfer to [facility]. [facility] home next week. Transfer to vent SDU bed  Billy Fischeravid Nakeita Styles, MD ; [facility] Mobile [phone].  After 5:30 PM or weekends, call [phone]

## 2014-06-21 NOTE — Clinical Social Work Note (Addendum)
Clinical Education officer, museum met with Kathryn Booth, Kathryn Booth along with MD and RNCM at bedside. Kathryn Booth expressed medical concerns with pt returning to [facility] and that pt would be returning home on Tuesday, Dec. 29th. Kathryn Booth further reported that he and Kathryn sister have been receiving ventilator and tracheostomy training to care for pt at home.   Kathryn Booth noticeably upset in regards to Kathryn condition from [facility] and passionate about providing Kathryn care. Kathryn Booth stated: "I love my wife and she is what is keeping me going everyday".  Kathryn Booth reported he has plenty of support at home and a 16-hr nurse. Kathryn Booth refusing to have pt discharged to [facility] and wishes to keep pt at [facility] until he can take her home early next week.   MD planning to submit stepdown orders. RNCM will follow.  Clinical Social Worker will sign off for now as social work intervention is no longer needed. Please consult [place] again if new need arises.  Kathryn Booth, MSW, LCSWA [phone] 06/21/2014 3:29 PM

## 2014-06-21 NOTE — Clinical Social Work Note (Signed)
Clinical Social Worker attempted to contact pt's significant other listed, Cornel Swift and left a message in reference to discharge plans for today. CSW awaiting a returned phone call.   FL-2 signed.   CSW will continue to follow for disposition plans.   Derenda FennelBashira Willella Harding, MSW, LCSWA [phone] 06/21/2014 11:22 AM

## 2014-06-21 NOTE — Clinical Social Work Note (Signed)
[facility]. CSW will facilitate discharge needs.   Derenda FennelBashira Kenneth Cuaresma, MSW, LCSWA [phone] 06/21/2014 10:54 AM

## 2014-06-21 NOTE — Progress Notes (Addendum)
ANTIBIOTIC CONSULT NOTE - INITIAL  Pharmacy Consult:  Cefepime Indication:  GNR PNA  Allergies  Allergen Reactions  . Tape Rash    Patient Measurements: Height: 5' (152.4 cm) Weight: 140 lb 14 oz (63.9 kg) IBW/kg (Calculated) : 45.5  Vital Signs: Temp: 98.6 F (37 C) (12/23 0806) Temp Source: Oral (12/23 0806) BP: 105/64 mmHg (12/23 0900) Pulse Rate: 119 (12/23 1000) Intake/Output from previous day: 12/22 0701 - 12/23 0700 In: 2792.5 [I.V.:1468.3; NG/GT:734.2; IV Piggyback:500] Out: 1635 [Urine:1635] Intake/Output from this shift: Total I/O In: 215 [I.V.:60; NG/GT:55; IV Piggyback:100] Out: 60 [Urine:60]  Labs:  Recent Labs  06/19/14 2225 06/20/14 0330 06/21/14 0445  WBC 15.2* 13.8* 11.7*  HGB 10.1* 9.7* 9.1*  PLT 309 273 253  CREATININE <0.20* <0.30* <0.30*   CrCl cannot be calculated (This lab value for creatinine cannot be used to calculate CrCl because it is not a number: <0.30). No results for input(s): VANCOTROUGH, VANCOPEAK, VANCORANDOM, GENTTROUGH, GENTPEAK, GENTRANDOM, TOBRATROUGH, TOBRAPEAK, TOBRARND, AMIKACINPEAK, AMIKACINTROU, AMIKACIN in the last 72 hours.   Microbiology: Recent Results (from the past 720 hour(s))  Urine culture     Status: None   Collection Time: 06/19/14  8:13 PM  Result Value Ref Range Status   Specimen Description URINE, CATHETERIZED  Final   Special Requests NONE  Final   Culture  Setup Time   Final    06/20/2014 04:19 Performed at [facility]    Colony Count   Final    30,000 COLONIES/ML Performed at [facility]    Culture   Final    Multiple bacterial morphotypes present, none predominant. Suggest appropriate recollection if clinically indicated. Performed at [facility]    Report Status 06/21/2014 FINAL  Final  MRSA PCR Screening     Status: None   Collection Time: 06/20/14 12:20 AM  Result Value Ref Range Status   MRSA by PCR NEGATIVE NEGATIVE Final    Comment:        The GeneXpert  MRSA Assay (FDA approved for NASAL specimens only), is one component of a comprehensive MRSA colonization surveillance program. It is not intended to diagnose MRSA infection nor to guide or monitor treatment for MRSA infections.   Culture, respiratory (NON-Expectorated)     Status: None (Preliminary result)   Collection Time: 06/20/14  1:18 AM  Result Value Ref Range Status   Specimen Description TRACHEAL ASPIRATE  Final   Special Requests NONE  Final   Gram Stain   Final    MODERATE WBC PRESENT, PREDOMINANTLY PMN RARE SQUAMOUS EPITHELIAL CELLS PRESENT RARE GRAM NEGATIVE RODS Performed at [facility]    Culture   Final    ABUNDANT GRAM NEGATIVE RODS Performed at [facility]    Report Status PENDING  Incomplete    Medical History: Past Medical History  Diagnosis Date  . Osteomyelitis   . Epidural abscess   . Respiratory failure   . Metabolic encephalopathy   . Nonischemic cardiomyopathy   . Hypotension   . Osteomyelitis of vertebra of cervical region   . Bipolar 1 disorder   . Anxiety disorder   . Hypoxemia   . Bacteremia   . Hypoxic ischemic encephalopathy (HIE)   . Cutaneous abscess of back excluding buttocks   . Quadriplegia   . Dysphagia, oropharyngeal phase   . Tracheostomy in place       Assessment: [age] YOF admitted from [facility] for evaluation of tachycardia.  She has a complicated infectious history and is  continued on dicloxacillin from PTA for history of cervical hardware/osteomyelitis.  Pharmacy consulted to add Cefepime to Cipro for PNA and UTI.  Patient is quadriplegic; therefore, her SCr is falsely low.  Cipro 12/22 >> Cefepime 12/23 >> Dicloxacillin PTA 12/15 >> 08/14/14 Fluc 12/22 >> 12/23  12/22 TA cx - GNR 12/21 BCx x2 -  12/21 UCx - negative   Goal of Therapy:  Clearance of infection   Plan:  - Cefepime 1gm IV Q8H - Continue Cipro 400mg  IV Q12H - Monitor renal fxn, clinical progress, micro data    Margaretha Mahan D.  Laney Potashang, PharmD, BCPS Pager:  [phone] 06/21/2014, 10:47 AM

## 2014-06-21 NOTE — Clinical Social Work Psychosocial (Signed)
Clinical Social Work Department BRIEF PSYCHOSOCIAL ASSESSMENT 06/21/2014  Patient:  Kathryn Booth,Kathryn Booth     Account Number:  [id number]     Admit date:  06/19/2014  Clinical Social Worker:  Derenda FennelNIXON,Lannis Lichtenwalner, CLINICAL SOCIAL WORKER  Date/Time:  06/20/2014 10:10 AM  Referred by:  Physician  Date Referred:  06/20/2014 Referred for  SNF Placement   Other Referral:   Interview type:  Other - See comment Other interview type:   CSW spoke with [name] via telephone.    PSYCHOSOCIAL DATA Living Status:  FACILITY Admitted from facility:   Level of care:  Skilled Nursing Facility Primary support name:  Kathryn Booth Primary support relationship to patient:  FAMILY Degree of support available:   Good    CURRENT CONCERNS Current Concerns  Post-Acute Placement   Other Concerns:    SOCIAL WORK ASSESSMENT / PLAN Clinical Social Worker spoke with Kathryn Booth in reference to Kathryn Booth's return to [facility]. CSW explained role and provided discharge information.  Kathryn Booth confirmed Kathryn Booth's return to [facility] and expressed concerns in regards to Kathryn Booth's medical condition. CSW advised Kathryn Booth to contact Kathryn Booth's nurse for any medical updates and provided unit telephone number. CSW faxed Kathryn Booth's clinicals to [facility] for review and is currently awaiting a returned phone [facility]. CSW continues to provided support to Kathryn Booth and Kathryn Booth's family and facilitate Kathryn Booth's discharge needs.   Assessment/plan status:  Psychosocial Support/Ongoing Assessment of Needs Other assessment/ plan:   Information/referral to community resources:   n/a    PATIENT'S/FAMILY'S RESPONSE TO PLAN OF CARE: Kathryn Booth lying in bed, disoriented and on vent. Kathryn Booth's relative agreeable to Kathryn Booth's return to [facility] SN[facility].      Derenda FennelBashira Karam Dunson, MSW, LCSWA [phone] 06/21/2014 10:23 AM

## 2014-06-21 NOTE — Progress Notes (Signed)
eLink Physician-Brief Progress Note Patient Name: Prudence DavidsonDana Harris DOB: January 07, 1971 MRN: [id number]   Date of Service  06/21/2014  HPI/Events of Note    eICU Interventions  Hypokalemia -repleted      Intervention Category Intermediate Interventions: Electrolyte abnormality - evaluation and management  ALVA,RAKESH V. 06/21/2014, 5:46 AM

## 2014-06-22 ENCOUNTER — Inpatient Hospital Stay (HOSPITAL_COMMUNITY): Payer: Medicare Other

## 2014-06-22 DIAGNOSIS — J158 Pneumonia due to other specified bacteria: Secondary | ICD-10-CM

## 2014-06-22 DIAGNOSIS — R000 Tachycardia, unspecified: Secondary | ICD-10-CM | POA: Insufficient documentation

## 2014-06-22 DIAGNOSIS — J9621 Acute and chronic respiratory failure with hypoxia: Secondary | ICD-10-CM

## 2014-06-22 DIAGNOSIS — R7881 Bacteremia: Secondary | ICD-10-CM

## 2014-06-22 DIAGNOSIS — J156 Pneumonia due to other aerobic Gram-negative bacteria: Secondary | ICD-10-CM | POA: Insufficient documentation

## 2014-06-22 DIAGNOSIS — B952 Enterococcus as the cause of diseases classified elsewhere: Secondary | ICD-10-CM

## 2014-06-22 DIAGNOSIS — G8250 Quadriplegia, unspecified: Secondary | ICD-10-CM | POA: Insufficient documentation

## 2014-06-22 DIAGNOSIS — J9690 Respiratory failure, unspecified, unspecified whether with hypoxia or hypercapnia: Secondary | ICD-10-CM | POA: Insufficient documentation

## 2014-06-22 DIAGNOSIS — J1569 Pneumonia due to other gram-negative bacteria: Secondary | ICD-10-CM | POA: Insufficient documentation

## 2014-06-22 LAB — BASIC METABOLIC PANEL
Anion gap: 8 (ref 5–15)
BUN: 8 mg/dL (ref 6–23)
CHLORIDE: 98 meq/L (ref 96–112)
CO2: 24 mmol/L (ref 19–32)
Calcium: 9 mg/dL (ref 8.4–10.5)
GLUCOSE: 136 mg/dL — AB (ref 70–99)
Potassium: 3.3 mmol/L — ABNORMAL LOW (ref 3.5–5.1)
Sodium: 130 mmol/L — ABNORMAL LOW (ref 135–145)

## 2014-06-22 LAB — CULTURE, RESPIRATORY W GRAM STAIN

## 2014-06-22 LAB — CULTURE, BLOOD (ROUTINE X 2)

## 2014-06-22 LAB — CBC
HEMATOCRIT: 29.7 % — AB (ref 36.0–46.0)
HEMOGLOBIN: 9.4 g/dL — AB (ref 12.0–15.0)
MCH: 29.5 pg (ref 26.0–34.0)
MCHC: 31.6 g/dL (ref 30.0–36.0)
MCV: 93.1 fL (ref 78.0–100.0)
Platelets: 256 10*3/uL (ref 150–400)
RBC: 3.19 MIL/uL — AB (ref 3.87–5.11)
RDW: 14.5 % (ref 11.5–15.5)
WBC: 12.7 10*3/uL — ABNORMAL HIGH (ref 4.0–10.5)

## 2014-06-22 LAB — GLUCOSE, CAPILLARY
GLUCOSE-CAPILLARY: 137 mg/dL — AB (ref 70–99)
GLUCOSE-CAPILLARY: 140 mg/dL — AB (ref 70–99)
Glucose-Capillary: 123 mg/dL — ABNORMAL HIGH (ref 70–99)

## 2014-06-22 LAB — CULTURE, RESPIRATORY

## 2014-06-22 MED ORDER — VANCOMYCIN HCL IN DEXTROSE 750-5 MG/150ML-% IV SOLN
750.0000 mg | Freq: Two times a day (BID) | INTRAVENOUS | Status: DC
  Administered 2014-06-23 – 2014-06-24 (×5): 750 mg via INTRAVENOUS
  Filled 2014-06-22 (×8): qty 150

## 2014-06-22 MED ORDER — VANCOMYCIN HCL IN DEXTROSE 1-5 GM/200ML-% IV SOLN
1000.0000 mg | Freq: Once | INTRAVENOUS | Status: AC
  Administered 2014-06-22: 1000 mg via INTRAVENOUS
  Filled 2014-06-22: qty 200

## 2014-06-22 NOTE — Progress Notes (Signed)
Pt seen at this time for trach consult.  Pt remains on full vent support at this time and #6XLT secure.  No education needed at this time.  Will continue to follow pt for progress.

## 2014-06-22 NOTE — Progress Notes (Signed)
ANTIBIOTIC CONSULT NOTE   Pharmacy Consult:  Vancomycin Indication:  Enterococcus Bacteremia  Allergies  Allergen Reactions  . Tape Rash   Patient Measurements: Height: 5' (152.4 cm) Weight: 144 lb 10 oz (65.6 kg) IBW/kg (Calculated) : 45.5  Vital Signs: Temp: 98.5 F (36.9 C) (12/24 0700) Temp Source: Oral (12/24 0700) BP: 105/66 mmHg (12/24 0900) Pulse Rate: 107 (12/24 0900) Intake/Output from previous day: 12/23 0701 - 12/24 0700 In: 1930 [I.V.:70; NG/GT:1210; IV Piggyback:650] Out: 2545 [Urine:2545] Intake/Output from this shift: Total I/O In: 110 [NG/GT:110] Out: 500 [Urine:500]  Recent Labs  06/20/14 0330 06/21/14 0445 06/21/14 1535 06/22/14 0420  WBC 13.8* 11.7*  --  12.7*  HGB 9.7* 9.1*  --  9.4*  PLT 273 253  --  256  CREATININE <0.30* <0.30* <0.30* <0.30*   Microbiology: Recent Results (from the past 720 hour(s))  Urine culture     Status: None   Collection Time: 06/19/14  8:13 PM  Result Value Ref Range Status   Specimen Description URINE, CATHETERIZED  Final   Special Requests NONE  Final   Culture  Setup Time   Final    06/20/2014 04:19 Performed at [facility]    Colony Count   Final    30,000 COLONIES/ML Performed at [facility]    Culture   Final    Multiple bacterial morphotypes present, none predominant. Suggest appropriate recollection if clinically indicated. Performed at [facility]    Report Status 06/21/2014 FINAL  Final  MRSA PCR Screening     Status: None   Collection Time: 06/20/14 12:20 AM  Result Value Ref Range Status   MRSA by PCR NEGATIVE NEGATIVE Final    Comment:        The GeneXpert MRSA Assay (FDA approved for NASAL specimens only), is one component of a comprehensive MRSA colonization surveillance program. It is not intended to diagnose MRSA infection nor to guide or monitor treatment for MRSA infections.   Culture, blood (routine x 2)     Status: None (Preliminary result)   Collection Time: 06/20/14  1:09 AM  Result Value Ref Range Status   Specimen Description BLOOD LEFT HAND  Final   Special Requests BOTTLES DRAWN AEROBIC AND ANAEROBIC 5CC EA  Final   Culture  Setup Time   Final    06/20/2014 04:50 Performed at [facility]    Culture   Final    ENTEROCOCCUS SPECIES Note: Gram Stain Report Called to,Read Back By and Verified With: Tory EmeraldMICHELLE TOLER RN [id number] Performed at [facility]    Report Status PENDING  Incomplete  Culture, blood (routine x 2)     Status: None (Preliminary result)   Collection Time: 06/20/14  1:16 AM  Result Value Ref Range Status   Specimen Description BLOOD RIGHT HAND  Final   Special Requests BOTTLES DRAWN AEROBIC AND ANAEROBIC 5CC EA  Final   Culture  Setup Time   Final    06/20/2014 04:47 Performed at [facility]    Culture   Final           BLOOD CULTURE RECEIVED NO GROWTH TO DATE CULTURE WILL BE HELD FOR 5 DAYS BEFORE ISSUING A FINAL NEGATIVE REPORT Performed at [facility]    Report Status PENDING  Incomplete  Culture, respiratory (NON-Expectorated)     Status: None   Collection Time: 06/20/14  1:18 AM  Result Value Ref Range Status   Specimen Description TRACHEAL ASPIRATE  Final  Special Requests NONE  Final   Gram Stain   Final    MODERATE WBC PRESENT, PREDOMINANTLY PMN RARE SQUAMOUS EPITHELIAL CELLS PRESENT RARE GRAM NEGATIVE RODS Performed at [facility]    Culture   Final    ABUNDANT SERRATIA MARCESCENS Performed at [facility]    Report Status 06/22/2014 FINAL  Final   Organism ID, Bacteria SERRATIA MARCESCENS  Final      Susceptibility   Serratia marcescens - MIC*    CEFAZOLIN >=64 RESISTANT Resistant     CEFEPIME <=1 SENSITIVE Sensitive     CEFTAZIDIME <=1 SENSITIVE Sensitive     CEFTRIAXONE <=1 SENSITIVE Sensitive     CIPROFLOXACIN <=0.25 SENSITIVE Sensitive     GENTAMICIN <=1 SENSITIVE Sensitive     TOBRAMYCIN <=1 SENSITIVE Sensitive      TRIMETH/SULFA <=20 SENSITIVE Sensitive     * ABUNDANT SERRATIA MARCESCENS   Medical History: Past Medical History  Diagnosis Date  . Osteomyelitis   . Epidural abscess   . Respiratory failure   . Metabolic encephalopathy   . Nonischemic cardiomyopathy   . Hypotension   . Osteomyelitis of vertebra of cervical region   . Bipolar 1 disorder   . Anxiety disorder   . Hypoxemia   . Bacteremia   . Hypoxic ischemic encephalopathy (HIE)   . Cutaneous abscess of back excluding buttocks   . Quadriplegia   . Dysphagia, oropharyngeal phase   . Tracheostomy in place    Assessment: [age] YOF admitted from [facility] for evaluation of tachycardia.  She has a complicated infectious history and is continued on dicloxacillin from PTA for history of cervical hardware/osteomyelitis.  Pharmacy consulted to add Cefepime to Cipro for PNA and UTI.  Her cultures now reveal ABUNDANT SERRATIA MARCESCENS and 1/2 blood cultures for ENTEROCOCCUS SPECIES.  Patient is quadriplegic; therefore, her SCr is falsely low.  Cipro 12/22 >> Cefepime 12/23 >>12/24 Dicloxacillin PTA 12/15 >> 08/14/14 Fluc 12/22 >> 12/23 Vancomycin 12/24 >>  12/22 TA cx - Serratia Marcescens 12/21 BCx x2 - 1/2 Enterococcus 12/21 UCx - negative  Plan:  - Continue Cipro 400mg  IV Q12H - Vancomycin 1gm IV x 1 then begin maintenance regimen of 750 mg every 12 hours.  - Monitor renal fxn, clinical progress, micro data  Nadara MustardNita Pravin Perezperez, PharmD., MS Clinical Pharmacist Pager:  [phone] Thank you for allowing pharmacy to be part of this patients care team. 06/22/2014, 11:29 AM

## 2014-06-22 NOTE — Consult Note (Signed)
[facility]     Reason for Consult: Enterococcus bacteremia    Referring Physician: Dr. Noelle Penner  Active Problems:   UTI (lower urinary tract infection)   Respiratory failure   Sinus tachycardia   Pneumonia due to Serratia marcescens   Quadriplegia   . antiseptic oral rinse  7 mL Mouth Rinse QID  . chlorhexidine  15 mL Mouth Rinse BID  . ciprofloxacin  400 mg Intravenous Q12H  . dicloxacillin  500 mg Per Tube 3 times per day  . famotidine  20 mg Per Tube BID  . heparin  5,000 Units Subcutaneous 3 times per day  . metoprolol tartrate  25 mg Per Tube Q8H  . midodrine  10 mg Per Tube TID WC    Recommendations: Start vancomycin pending sensitivities If WBC and fever improves on vancomycin, will stop cipro  Assessment: She is growing 1/2 blood cultures with Enterococcus.  Source could be urinary though sample does not appear to have been ideal, urine culture mixed.  Has been febrile to 102, WBC elevated, tachycardic.  ? HCAP but vent setting appear stable.     Antibiotics: cipro day 3 Cefepime 2 days Fluconazole 2 days dicloxacillin through February 15 for epidural abscess three times a day then chronic suppression after that due to hardware   HPI: Kathryn Booth is a 43 y.o. female with quadriplegia due to cervical abscess at [facility] presenting with neck pain and found cervical epidural abscess, MSSA culture and then transferred to [facility] October 15th, intubated and underwent OR debridement, was in shock, multiorgan failure and has had poor neurologic outcome, VDRF and is now in [facility].  She was on Ancef through December 14th and transitioned to oral dicloxacillin tid for continuation through February 15th with the plan then to continue with lifetime suppression of MSSA antibiotics due to hardware being placed emergently during surgery after that (I believe same dicloxacillin bid).  She was brought in to [facility] due to  persistent tachycardia for evaulation.  UA suggested infection but culture did not grow anything, CXR with opacity but no significant vent changes or secretions and now has 1/2 blood cultures with Enterococcus.    Review of Systems: Review of systems not obtained due to patient factors.  Past Medical History  Diagnosis Date  . Osteomyelitis   . Epidural abscess   . Respiratory failure   . Metabolic encephalopathy   . Nonischemic cardiomyopathy   . Hypotension   . Osteomyelitis of vertebra of cervical region   . Bipolar 1 disorder   . Anxiety disorder   . Hypoxemia   . Bacteremia   . Hypoxic ischemic encephalopathy (HIE)   . Cutaneous abscess of back excluding buttocks   . Quadriplegia   . Dysphagia, oropharyngeal phase   . Tracheostomy in place     History  Substance Use Topics  . Smoking status: Unknown If Ever Smoked  . Smokeless tobacco: Not on file  . Alcohol Use: No    No family history on file. Allergies  Allergen Reactions  . Tape Rash    OBJECTIVE: Blood pressure 105/66, pulse 107, temperature 98.5 F (36.9 C), temperature source Oral, resp. rate 21, height 5' (1.524 m), weight 144 lb 10 oz (65.6 kg), SpO2 91 %. General: on vent, eyes open, does not respond to commands Skin: no rashes Lungs: diffuse rhonchi Cor: tachy  Abdomen: soft, nt, nd Ext: no edema  Microbiology: Recent Results (from the past 240  hour(s))  Urine culture     Status: None   Collection Time: 06/19/14  8:13 PM  Result Value Ref Range Status   Specimen Description URINE, CATHETERIZED  Final   Special Requests NONE  Final   Culture  Setup Time   Final    06/20/2014 04:19 Performed at [facility]    Colony Count   Final    30,000 COLONIES/ML Performed at [facility]    Culture   Final    Multiple bacterial morphotypes present, none predominant. Suggest appropriate recollection if clinically indicated. Performed at [facility]    Report Status  06/21/2014 FINAL  Final  MRSA PCR Screening     Status: None   Collection Time: 06/20/14 12:20 AM  Result Value Ref Range Status   MRSA by PCR NEGATIVE NEGATIVE Final    Comment:        The GeneXpert MRSA Assay (FDA approved for NASAL specimens only), is one component of a comprehensive MRSA colonization surveillance program. It is not intended to diagnose MRSA infection nor to guide or monitor treatment for MRSA infections.   Culture, blood (routine x 2)     Status: None (Preliminary result)   Collection Time: 06/20/14  1:09 AM  Result Value Ref Range Status   Specimen Description BLOOD LEFT HAND  Final   Special Requests BOTTLES DRAWN AEROBIC AND ANAEROBIC 5CC EA  Final   Culture  Setup Time   Final    06/20/2014 04:50 Performed at [facility]    Culture   Final    ENTEROCOCCUS SPECIES Note: Gram Stain Report Called to,Read Back By and Verified With: Tory EmeraldMICHELLE TOLER RN 735P Performed at [facility]    Report Status PENDING  Incomplete  Culture, blood (routine x 2)     Status: None (Preliminary result)   Collection Time: 06/20/14  1:16 AM  Result Value Ref Range Status   Specimen Description BLOOD RIGHT HAND  Final   Special Requests BOTTLES DRAWN AEROBIC AND ANAEROBIC 5CC EA  Final   Culture  Setup Time   Final    06/20/2014 04:47 Performed at [facility]    Culture   Final           BLOOD CULTURE RECEIVED NO GROWTH TO DATE CULTURE WILL BE HELD FOR 5 DAYS BEFORE ISSUING A FINAL NEGATIVE REPORT Performed at [facility]    Report Status PENDING  Incomplete  Culture, respiratory (NON-Expectorated)     Status: None   Collection Time: 06/20/14  1:18 AM  Result Value Ref Range Status   Specimen Description TRACHEAL ASPIRATE  Final   Special Requests NONE  Final   Gram Stain   Final    MODERATE WBC PRESENT, PREDOMINANTLY PMN RARE SQUAMOUS EPITHELIAL CELLS PRESENT RARE GRAM NEGATIVE RODS Performed at [facility]    Culture    Final    ABUNDANT SERRATIA MARCESCENS Performed at [facility]    Report Status 06/22/2014 FINAL  Final   Organism ID, Bacteria SERRATIA MARCESCENS  Final      Susceptibility   Serratia marcescens - MIC*    CEFAZOLIN >=64 RESISTANT Resistant     CEFEPIME <=1 SENSITIVE Sensitive     CEFTAZIDIME <=1 SENSITIVE Sensitive     CEFTRIAXONE <=1 SENSITIVE Sensitive     CIPROFLOXACIN <=0.25 SENSITIVE Sensitive     GENTAMICIN <=1 SENSITIVE Sensitive     TOBRAMYCIN <=1 SENSITIVE Sensitive     TRIMETH/SULFA <=20 SENSITIVE  Sensitive     * ABUNDANT SERRATIA MARCESCENS    Jru Pense, MD [facility] [email or web address] [phone] pager  [phone] cell 06/22/2014, 10:45 AM

## 2014-06-22 NOTE — Progress Notes (Signed)
PULMONARY / CRITICAL CARE MEDICINE   Name: Kathryn Booth MRN: [id number] DOB: 1970-07-03    ADMISSION DATE:  06/19/2014 CONSULTATION DATE:  06/22/2014  REFERRING MD :  [facility]  CHIEF COMPLAINT:  Tachycardia  INITIAL PRESENTATION:   43 y.o. 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  Recently admitted to [facility] (06/07/14).  Has had persistent tachycardia while at [facility].  [facility] after husbands request for further evaluation of tachycardia.  In ED, UA suggestive of UTI.  STUDIES/SIGNIFICANT EVENTS: 10/12 debridement of cervical abscess [facility] 12/09 admitted to [facility] 12/[facility] with hx as above  INDWELLING DEVICES:: Trach (chronic) G tube (chronic) RUE PICC (chronic)  MICRO DATA: Resp 12/22 >> Serratia  ANTIMICROBIALS:  Dicloxacillin (long term for MSSA epidural abscess) >> 08/14/14 Fluconazole 12/21 >> 12/23 Cipro 12/21 >>  Cefepime 12/23 >> 12/24  SUBJECTIVE:  Intermittently follows commands.  VITAL SIGNS: Temp:  [98.2 F (36.8 C)-101.7 F (38.7 C)] 98.5 F (36.9 C) (12/24 0700) Pulse Rate:  [92-121] 107 (12/24 0900) Resp:  [14-28] 21 (12/24 0900) BP: (82-107)/(36-71) 105/66 mmHg (12/24 0900) SpO2:  [91 %-100 %] 91 % (12/24 0956) FiO2 (%):  [30 %] 30 % (12/24 0956) Weight:  [144 lb 10 oz (65.6 kg)] 144 lb 10 oz (65.6 kg) (12/24 0500) VENTILATOR SETTINGS: Vent Mode:  [-] PRVC FiO2 (%):  [30 %] 30 % Set Rate:  [15 bmp] 15 bmp Vt Set:  [450 mL] 450 mL PEEP:  [5 cmH20] 5 cmH20 Plateau Pressure:  [23 cmH20-30 cmH20] 30 cmH20 INTAKE / OUTPUT: Intake/Output      12/23 0701 - 12/24 0700 12/24 0701 - 12/25 0700   I.V. (mL/kg) 70 (1.1)    Other     NG/GT 1210 110   IV Piggyback 650    Total Intake(mL/kg) 1930 (29.4) 110 (1.7)   Urine (mL/kg/hr) 2545 (1.6) 500 (2.6)   Total Output 2545 500   Net -615 -390        Stool Occurrence 1 x      PHYSICAL EXAMINATION: General: no distress Neuro: will stick out  tongue on commad HEENT: trach site clean Cardiovascular: regular, tachycardic Lungs: scattered rhonchi Abdomen: soft, non tender, PEG site clean Ext: 1+ edema   LABS: CBC Recent Labs     06/20/14  0330  06/21/14  0445  06/22/14  0420  WBC  13.8*  11.7*  12.7*  HGB  9.7*  9.1*  9.4*  HCT  31.7*  28.6*  29.7*  PLT  273  253  256    BMET Recent Labs     06/21/14  0445  06/21/14  1535  06/22/14  0420  NA  132*  136  130*  K  2.5*  4.6  3.3*  CL  96  102  98  CO2  28  26  24   BUN  10  8  8   CREATININE  <0.30*  <0.30*  <0.30*  GLUCOSE  130*  125*  136*    Electrolytes Recent Labs     06/20/14  0330  06/21/14  0445  06/21/14  1535  06/22/14  0420  CALCIUM  8.9  8.8  9.5  9.0  MG  1.6  1.7   --    --   PHOS  3.8   --    --    --     ABG Recent Labs     06/19/14  2035  PHART  7.398  PCO2ART  60.2*  PO2ART  80.0    Liver Enzymes Recent Labs     06/19/14  2014  06/21/14  0445  AST  30  34  ALT  79*  66*  ALKPHOS  96  62  BILITOT  0.2*  0.6  ALBUMIN  2.8*  2.4*    Glucose Recent Labs     06/20/14  1541  06/21/14  0017  06/21/14  0801  06/21/14  1513  06/22/14  0008  06/22/14  0757  GLUCAP  94  131*  146*  116*  140*  137*    Imaging Dg Chest Port 1 View  06/22/2014   CLINICAL DATA:  Difficulty breathing.  EXAM: PORTABLE CHEST - 1 VIEW  COMPARISON:  06/20/2014.  FINDINGS: Tracheostomy tube in stable position. PICC line in stable position with tip in superior vena cava. Surgical clips over the upper mid chest. Stent noted projected over the left pulmonary artery. Stable cardiomegaly. Persistent left lower lobe consolidation. Left pleural effusion. No pneumothorax. No acute osseous abnormality.  IMPRESSION: 1. Lines and tubes in stable position. Stable stent positioning. Stent projected over the left pulmonary artery. 2. Stable consolidation of the left lower lobe with small left pleural effusion. 3. Stable cardiomegaly.   Electronically Signed    By: Maisie Fushomas  Register   On: 06/22/2014 07:15       ASSESSMENT / PLAN:  PULMONARY A: Acute on chronic respiratory failure from PNA. S/p tracheostomy. P:   Full vent support >> she is not able to tolerate vent weaning F/u CXR intermittently  CARDIOVASCULAR A:  Sinus tachycardia. Chronic hypotension. P:  Continue lopressor Continue midodrine  RENAL A:   Hypokalemia. P:   F/u and replace electrolytes as needed  GASTROINTESTINAL A:   Protein calorie malnutrition s/p PEG tube. P:   Pepcid for SUP Continue tube feeds  HEMATOLOGIC A:   Anemia of chronic disease. Hx UE DVT - not on anticoagulation. P:  F/u CBC intermittently SQ heparin for DVT prevention  INFECTIOUS A:   Serratia HCAP. Chronic cervical osteomyelitis and diskitis - on dicloxacillin through 08/14/14. P:   Day 3 of cipro D/c cefepime 12/24 Continue long term dicloxacillin  ENDOCRINE A:   Hyperglycemia P:   Monitor blood sugars while on tube feeds  NEUROLOGIC A:   Quadreplegia.  P:   Monitor clinically  SUMMARY: Pt was previously at [facility] >> family would not want to return to [facility].  Plan for d/c home next week.  Okay to transfer to vent SDU bed.  Coralyn HellingVineet Chiquetta Langner, MD [facility] Pulmonary/Critical Care 06/22/2014, 10:10 AM Pager:  [phone] After 3pm call: [phone]

## 2014-06-23 DIAGNOSIS — R7881 Bacteremia: Secondary | ICD-10-CM

## 2014-06-23 DIAGNOSIS — R000 Tachycardia, unspecified: Secondary | ICD-10-CM

## 2014-06-23 DIAGNOSIS — B952 Enterococcus as the cause of diseases classified elsewhere: Secondary | ICD-10-CM

## 2014-06-23 LAB — BASIC METABOLIC PANEL
ANION GAP: 11 (ref 5–15)
BUN: 9 mg/dL (ref 6–23)
CHLORIDE: 103 meq/L (ref 96–112)
CO2: 21 mmol/L (ref 19–32)
Calcium: 9.3 mg/dL (ref 8.4–10.5)
GLUCOSE: 124 mg/dL — AB (ref 70–99)
Potassium: 3.4 mmol/L — ABNORMAL LOW (ref 3.5–5.1)
Sodium: 135 mmol/L (ref 135–145)

## 2014-06-23 LAB — GLUCOSE, CAPILLARY
GLUCOSE-CAPILLARY: 121 mg/dL — AB (ref 70–99)
Glucose-Capillary: 122 mg/dL — ABNORMAL HIGH (ref 70–99)
Glucose-Capillary: 126 mg/dL — ABNORMAL HIGH (ref 70–99)
Glucose-Capillary: 156 mg/dL — ABNORMAL HIGH (ref 70–99)

## 2014-06-23 LAB — MAGNESIUM: Magnesium: 1.9 mg/dL (ref 1.5–2.5)

## 2014-06-23 MED ORDER — POTASSIUM CHLORIDE 20 MEQ/15ML (10%) PO SOLN
40.0000 meq | ORAL | Status: AC
  Administered 2014-06-23 (×2): 40 meq via ORAL
  Filled 2014-06-23 (×2): qty 30

## 2014-06-23 MED ORDER — CIPROFLOXACIN IN D5W 400 MG/200ML IV SOLN
400.0000 mg | Freq: Two times a day (BID) | INTRAVENOUS | Status: AC
  Administered 2014-06-23 – 2014-06-24 (×3): 400 mg via INTRAVENOUS
  Filled 2014-06-23 (×3): qty 200

## 2014-06-23 NOTE — Progress Notes (Addendum)
[facility]       Day # 2 vancomycin Day # 4 ciprofloxacin  On chronic dicloxacillin  Subjective: No new complaints   Antibiotics:  Anti-infectives    Start     Dose/Rate Route Frequency Ordered Stop   06/22/14 2300  vancomycin (VANCOCIN) IVPB 750 mg/150 ml premix     750 mg150 mL/hr over 60 Minutes Intravenous Every 12 hours 06/22/14 1134     06/22/14 1130  vancomycin (VANCOCIN) IVPB 1000 mg/200 mL premix     1,000 mg200 mL/hr over 60 Minutes Intravenous  Once 06/22/14 1128 06/22/14 1315   06/21/14 1200  ceFEPIme (MAXIPIME) 1 g in dextrose 5 % 50 mL IVPB  Status:  Discontinued     1 g100 mL/hr over 30 Minutes Intravenous 3 times per day 06/21/14 1049 06/22/14 1012   06/21/14 1100  ceFEPIme (MAXIPIME) 1 g in dextrose 5 % 50 mL IVPB  Status:  Discontinued     1 g100 mL/hr over 30 Minutes Intravenous Every 12 hours 06/21/14 1047 06/21/14 1049   06/20/14 1000  ciprofloxacin (CIPRO) IVPB 400 mg     400 mg200 mL/hr over 60 Minutes Intravenous Every 12 hours 06/20/14 0919     06/20/14 1000  fluconazole (DIFLUCAN) 40 MG/ML suspension 200 mg  Status:  Discontinued     200 mg Per Tube Daily 06/20/14 0934 06/21/14 1101   06/20/14 0600  dicloxacillin (DYNAPEN) capsule 500 mg     500 mg Per Tube 3 times per day 06/20/14 0219     06/20/14 0000  fluconazole (DIFLUCAN) IVPB 200 mg  Status:  Discontinued     200 mg100 mL/hr over 60 Minutes Intravenous Every 24 hours 06/19/14 2349 06/20/14 0934      Medications: Scheduled Meds: . antiseptic oral rinse  7 mL Mouth Rinse QID  . chlorhexidine  15 mL Mouth Rinse BID  . ciprofloxacin  400 mg Intravenous Q12H  . dicloxacillin  500 mg Per Tube 3 times per day  . famotidine  20 mg Per Tube BID  . heparin  5,000 Units Subcutaneous 3 times per day  . metoprolol tartrate  25 mg Per Tube Q8H  . midodrine  10 mg Per Tube TID WC  . vancomycin  750 mg Intravenous Q12H   Continuous Infusions: . feeding supplement  (VITAL AF 1.2 CAL) 1,000 mL (06/22/14 1745)   PRN Meds:.sodium chloride, metoprolol    Objective: Weight change: -8 lb 2.5 oz (-3.7 kg)  Intake/Output Summary (Last 24 hours) at 06/23/14 1517 Last data filed at 06/23/14 1500  Gross per 24 hour  Intake   2240 ml  Output   2270 ml  Net    -30 ml   Blood pressure 108/67, pulse 98, temperature 99.1 F (37.3 C), temperature source Oral, resp. rate 25, height 5' (1.524 m), weight 136 lb 7.4 oz (61.9 kg), SpO2 99 %. Temp:  [99.1 F (37.3 C)-99.8 F (37.7 C)] 99.1 F (37.3 C) (12/25 1210) Pulse Rate:  [95-108] 98 (12/25 1500) Resp:  [24-27] 25 (12/25 1500) BP: (88-113)/(46-77) 108/67 mmHg (12/25 1400) SpO2:  [94 %-100 %] 99 % (12/25 1500) FiO2 (%):  [30 %] 30 % (12/25 1200) Weight:  [136 lb 7.4 oz (61.9 kg)] 136 lb 7.4 oz (61.9 kg) (12/25 0500)  Physical Exam: General: awake but not following commands HEENT: , EOMI CVS tachycardic, normal r,  no murmur rubs or gallops Chest: fairlyclear to auscultation bilaterally, no wheezing Abdomen: soft  nondistended,  normal bowel sounds, Neuro: paraplegic  CBC:  CBC Latest Ref Rng 06/22/2014 06/21/2014 06/20/2014  WBC 4.0 - 10.5 K/uL 12.7(H) 11.7(H) 13.8(H)  Hemoglobin 12.0 - 15.0 g/dL 9.4(L) 9.1(L) 9.7(L)  Hematocrit 36.0 - 46.0 % 29.7(L) 28.6(L) 31.7(L)  Platelets 150 - 400 K/uL 256 253 273       BMET  Recent Labs  06/22/14 0420 06/23/14 0545  NA 130* 135  K 3.3* 3.4*  CL 98 103  CO2 24 21  GLUCOSE 136* 124*  BUN 8 9  CREATININE <0.30* <0.30*  CALCIUM 9.0 9.3     Liver Panel   Recent Labs  06/21/14 0445  PROT 5.8*  ALBUMIN 2.4*  AST 34  ALT 66*  ALKPHOS 62  BILITOT 0.6       Sedimentation Rate No results for input(s): ESRSEDRATE in the last 72 hours. C-Reactive Protein No results for input(s): CRP in the last 72 hours.  Micro Results: Recent Results (from the past 720 hour(s))  Urine culture     Status: None   Collection Time: 06/19/14  8:13 PM   Result Value Ref Range Status   Specimen Description URINE, CATHETERIZED  Final   Special Requests NONE  Final   Culture  Setup Time   Final    06/20/2014 04:19 Performed at [facility]   Final    30,000 COLONIES/ML Performed at [facility]    Culture   Final    Multiple bacterial morphotypes present, none predominant. Suggest appropriate recollection if clinically indicated. Performed at [facility]    Report Status 06/21/2014 FINAL  Final  MRSA PCR Screening     Status: None   Collection Time: 06/20/14 12:20 AM  Result Value Ref Range Status   MRSA by PCR NEGATIVE NEGATIVE Final    Comment:        The GeneXpert MRSA Assay (FDA approved for NASAL specimens only), is one component of a comprehensive MRSA colonization surveillance program. It is not intended to diagnose MRSA infection nor to guide or monitor treatment for MRSA infections.   Culture, blood (routine x 2)     Status: None (Preliminary result)   Collection Time: 06/20/14  1:09 AM  Result Value Ref Range Status   Specimen Description BLOOD LEFT HAND  Final   Special Requests BOTTLES DRAWN AEROBIC AND ANAEROBIC 5CC EA  Final   Culture  Setup Time   Final    06/20/2014 04:50 Performed at [facility]    Culture   Final    ENTEROCOCCUS SPECIES Note: Gram Stain Report Called to,Read Back By and Verified With: Allegra Grana RN 19P Performed at [facility]    Report Status PENDING  Incomplete  Culture, blood (routine x 2)     Status: None (Preliminary result)   Collection Time: 06/20/14  1:16 AM  Result Value Ref Range Status   Specimen Description BLOOD RIGHT HAND  Final   Special Requests BOTTLES DRAWN AEROBIC AND ANAEROBIC 5CC EA  Final   Culture  Setup Time   Final    06/20/2014 04:47 Performed at [facility]    Culture   Final           BLOOD CULTURE RECEIVED NO GROWTH TO DATE CULTURE WILL BE HELD FOR 5 DAYS BEFORE ISSUING A  FINAL NEGATIVE REPORT Performed at [facility]    Report Status PENDING  Incomplete  Culture, respiratory (NON-Expectorated)     Status: None  Collection Time: 06/20/14  1:18 AM  Result Value Ref Range Status   Specimen Description TRACHEAL ASPIRATE  Final   Special Requests NONE  Final   Gram Stain   Final    MODERATE WBC PRESENT, PREDOMINANTLY PMN RARE SQUAMOUS EPITHELIAL CELLS PRESENT RARE GRAM NEGATIVE RODS Performed at [facility]    Culture   Final    ABUNDANT SERRATIA MARCESCENS Performed at [facility]    Report Status 06/22/2014 FINAL  Final   Organism ID, Bacteria SERRATIA MARCESCENS  Final      Susceptibility   Serratia marcescens - MIC*    CEFAZOLIN >=64 RESISTANT Resistant     CEFEPIME <=1 SENSITIVE Sensitive     CEFTAZIDIME <=1 SENSITIVE Sensitive     CEFTRIAXONE <=1 SENSITIVE Sensitive     CIPROFLOXACIN <=0.25 SENSITIVE Sensitive     GENTAMICIN <=1 SENSITIVE Sensitive     TOBRAMYCIN <=1 SENSITIVE Sensitive     TRIMETH/SULFA <=20 SENSITIVE Sensitive     * ABUNDANT SERRATIA MARCESCENS    Studies/Results: Dg Chest Port 1 View  06/22/2014   CLINICAL DATA:  Difficulty breathing.  EXAM: PORTABLE CHEST - 1 VIEW  COMPARISON:  06/20/2014.  FINDINGS: Tracheostomy tube in stable position. PICC line in stable position with tip in superior vena cava. Surgical clips over the upper mid chest. Stent noted projected over the left pulmonary artery. Stable cardiomegaly. Persistent left lower lobe consolidation. Left pleural effusion. No pneumothorax. No acute osseous abnormality.  IMPRESSION: 1. Lines and tubes in stable position. Stable stent positioning. Stent projected over the left pulmonary artery. 2. Stable consolidation of the left lower lobe with small left pleural effusion. 3. Stable cardiomegaly.   Electronically Signed   By: Marcello Moores  Register   On: 06/22/2014 07:15      Assessment/Plan:  Active Problems:   UTI (lower urinary tract  infection)   Respiratory failure   Sinus tachycardia   Pneumonia due to Serratia marcescens   Quadriplegia   Enterococcal bacteremia    Carlisia Geno is a 43 y.o. female with  43 y.o. female with quadriplegia due to cervical abscess at [facility] presenting with neck pain and found cervical epidural abscess, MSSA culture and then transferred to [facility] October 15th, intubated and underwent OR debridement, was in shock, multiorgan failure and has had poor neurologic outcome, VDRF and is now in [place]. She was on Ancef through December 14th and transitioned to oral dicloxacillin tid for continuation through February 15th with the plan then to continue with lifetime suppression of MSSA antibiotics due to hardware being placed emergently during surgery after that (I believe same dicloxacillin bid). She was brought in to [facility] due to persistent tachycardia for evaulation. UA suggested infection but culture did not grow anything, CXR with opacity but no significant vent changes or secretions and now has 1/2 blood cultures with Enterococcus.   #1 Enterococcus: only growing in 1 out of 2 cultures.  I'm not convinced that this is a genuine pathogen.  if it only grows in one culture I would certainly not subject her to a transesophageal echocardiogram.   I would at most give HER-2 weeks of IV vancomycin for this.   #2 healthcare associated pneumonia: Serratia isolated on culture she has received 4 days of ciprofloxacin I would stop her ciprofloxacin after day 5.  #3 epidural abscess with surgery and hardware placed plans for her to be on lifetime dicloxacillin would continue the dicloxacillin per plan as outlined in Dr.  Comer's note  I will follow up her culture results but unless something dramatic changes such as her growing Enterococcus in 2/2 cultures I will sign off. Please call with further questions.     LOS: 4 days   Alcide Evener 06/23/2014, 3:17 PM

## 2014-06-23 NOTE — Progress Notes (Signed)
PULMONARY / CRITICAL CARE MEDICINE   Name: Kathryn Booth MRN: [id number] DOB: [birth year]    ADMISSION DATE:  06/19/2014 CONSULTATION DATE:  06/23/2014  [facility]ING MD :  [facility]  CHIEF COMPLAINT:  Tachycardia  INITIAL PRESENTATION:   43 y.o. 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  Recently admitted to [facility] (06/07/14).  Has had persistent tachycardia while at [facility].  [facility] after husbands request for further evaluation of tachycardia.  In ED, UA suggestive of UTI.  STUDIES/SIGNIFICANT EVENTS: 10/12 debridement of cervical abscess [facility] 12/09 [facility] to [facility]  INDWELLING DEVICES:: Trach (chronic) G tube (chronic) RUE PICC (chronic)  MICRO DATA: Resp 12/22 >> Serratia Blood 12/22 >> Enterococcus  ANTIMICROBIALS:  Dicloxacillin (long term for MSSA epidural abscess) >> 08/14/14 Fluconazole 12/21 >> 12/23 Cipro 12/21 >>  Cefepime 12/23 >> 12/24 Vancomycin 12/24 >>  SUBJECTIVE:  Intermittently follows commands.  VITAL SIGNS: Temp:  [99.2 F (37.3 C)-99.8 F (37.7 C)] 99.4 F (37.4 C) (12/25 0805) Pulse Rate:  [95-110] 103 (12/25 0900) Resp:  [18-28] 26 (12/25 0900) BP: (88-112)/(46-71) 101/68 mmHg (12/25 0900) SpO2:  [91 %-100 %] 97 % (12/25 0900) FiO2 (%):  [30 %] 30 % (12/25 0800) Weight:  [136 lb 7.4 oz (61.9 kg)] 136 lb 7.4 oz (61.9 kg) (12/25 0500) VENTILATOR SETTINGS: Vent Mode:  [-] PRVC FiO2 (%):  [30 %] 30 % Set Rate:  [15 bmp] 15 bmp Vt Set:  [450 mL] 450 mL PEEP:  [5 cmH20] 5 cmH20 Plateau Pressure:  [22 cmH20-25 cmH20] 24 cmH20 INTAKE / OUTPUT: Intake/Output      12/24 0701 - 12/25 0700 12/25 0701 - 12/26 0700   I.V. (mL/kg)     NG/GT 1320 110   IV Piggyback 750    Total Intake(mL/kg) 2070 (33.4) 110 (1.8)   Urine (mL/kg/hr) 2720 (1.8)    Total Output 2720     Net -650 +110        Stool Occurrence 2 x      PHYSICAL EXAMINATION: General: no distress Neuro:  will stick out tongue on commad HEENT: trach site clean Cardiovascular: regular, tachycardic Lungs: scattered rhonchi Abdomen: soft, non tender, PEG site clean Ext: 1+ edema   LABS: CBC Recent Labs     06/21/14  0445  06/22/14  0420  WBC  11.7*  12.7*  HGB  9.1*  9.4*  HCT  28.6*  29.7*  PLT  253  256    BMET Recent Labs     06/21/14  1535  06/22/14  0420  06/23/14  0545  NA  136  130*  135  K  4.6  3.3*  3.4*  CL  102  98  103  CO2  26  24  21   BUN  8  8  9   CREATININE  <0.30*  <0.30*  <0.30*  GLUCOSE  125*  136*  124*    Electrolytes Recent Labs     06/21/14  0445  06/21/14  1535  06/22/14  0420  06/23/14  0545  CALCIUM  8.8  9.5  9.0  9.3  MG  1.7   --    --   1.9   Liver Enzymes Recent Labs     06/21/14  0445  AST  34  ALT  66*  ALKPHOS  62  BILITOT  0.6  ALBUMIN  2.4*    Glucose Recent Labs  06/21/14  1513  06/22/14  0008  06/22/14  0757  06/22/14  1548  06/23/14  0032  06/23/14  0747  GLUCAP  116*  140*  137*  123*  156*  122*    Imaging Dg Chest Port 1 View  06/22/2014   CLINICAL DATA:  Difficulty breathing.  EXAM: PORTABLE CHEST - 1 VIEW  COMPARISON:  06/20/2014.  FINDINGS: Tracheostomy tube in stable position. PICC line in stable position with tip in superior vena cava. Surgical clips over the upper mid chest. Stent noted projected over the left pulmonary artery. Stable cardiomegaly. Persistent left lower lobe consolidation. Left pleural effusion. No pneumothorax. No acute osseous abnormality.  IMPRESSION: 1. Lines and tubes in stable position. Stable stent positioning. Stent projected over the left pulmonary artery. 2. Stable consolidation of the left lower lobe with small left pleural effusion. 3. Stable cardiomegaly.   Electronically Signed   By: Maisie Fushomas  Register   On: 06/22/2014 07:15       ASSESSMENT / PLAN:  PULMONARY A: Acute on chronic respiratory failure from PNA. S/p tracheostomy. P:   Full vent support >> she  is not able to tolerate vent weaning F/u CXR intermittently Arrange for home vent  CARDIOVASCULAR A:  Sinus tachycardia. Chronic hypotension. P:  Continue lopressor Continue midodrine  RENAL A:   Hypokalemia. P:   F/u and replace electrolytes as needed  GASTROINTESTINAL A:   Protein calorie malnutrition s/p PEG tube. P:   Pepcid for SUP Continue tube feeds  HEMATOLOGIC A:   Anemia of chronic disease. Hx UE DVT - not on anticoagulation. P:  F/u CBC intermittently SQ heparin for DVT prevention  INFECTIOUS A:   Serratia HCAP. Chronic cervical osteomyelitis and diskitis - on dicloxacillin through 08/14/14. Enterococcal bacteremia. P:   Day 4 of cipro for HCAP Day 2 of vancomycin for bacteremia. Continue long term dicloxacillin  ENDOCRINE A:   Hyperglycemia P:   Monitor blood sugars while on tube feeds  NEUROLOGIC A:   Quadreplegia.  P:   Monitor clinically  SUMMARY: Pt was previously at [facility] >> family would not want to return to [facility].  Plan for d/c home next week.  Okay to transfer to vent SDU bed.  Updated pt's husband at bedside.  Coralyn HellingVineet Laken Rog, MD [facility] 06/23/2014, 9:05 AM Pager:  [phone] After 3pm call: [phone]

## 2014-06-24 LAB — CBC
HCT: 29 % — ABNORMAL LOW (ref 36.0–46.0)
Hemoglobin: 9.2 g/dL — ABNORMAL LOW (ref 12.0–15.0)
MCH: 29.2 pg (ref 26.0–34.0)
MCHC: 31.7 g/dL (ref 30.0–36.0)
MCV: 92.1 fL (ref 78.0–100.0)
Platelets: 258 10*3/uL (ref 150–400)
RBC: 3.15 MIL/uL — ABNORMAL LOW (ref 3.87–5.11)
RDW: 14.6 % (ref 11.5–15.5)
WBC: 11.7 10*3/uL — ABNORMAL HIGH (ref 4.0–10.5)

## 2014-06-24 LAB — BASIC METABOLIC PANEL
Anion gap: 10 (ref 5–15)
BUN: 11 mg/dL (ref 6–23)
CALCIUM: 9.4 mg/dL (ref 8.4–10.5)
CHLORIDE: 103 meq/L (ref 96–112)
CO2: 22 mmol/L (ref 19–32)
Creatinine, Ser: <0.3 mg/dL — ABNORMAL LOW (ref 0.50–1.10)
Glucose, Bld: 129 mg/dL — ABNORMAL HIGH (ref 70–99)
Potassium: 3.8 mmol/L (ref 3.5–5.1)
Sodium: 135 mmol/L (ref 135–145)

## 2014-06-24 LAB — GLUCOSE, CAPILLARY
GLUCOSE-CAPILLARY: 120 mg/dL — AB (ref 70–99)
GLUCOSE-CAPILLARY: 124 mg/dL — AB (ref 70–99)
GLUCOSE-CAPILLARY: 164 mg/dL — AB (ref 70–99)

## 2014-06-24 LAB — HEPATITIS PANEL, ACUTE
HCV Ab: NEGATIVE
HEP B C IGM: NONREACTIVE
Hep A IgM: NONREACTIVE
Hepatitis B Surface Ag: NEGATIVE

## 2014-06-24 LAB — HIV ANTIBODY (ROUTINE TESTING W REFLEX): HIV: NONREACTIVE

## 2014-06-24 LAB — MAGNESIUM: Magnesium: 1.9 mg/dL (ref 1.5–2.5)

## 2014-06-24 NOTE — Progress Notes (Signed)
PULMONARY / CRITICAL CARE MEDICINE   Name: Kathryn DavidsonDana Booth MRN: [id number] DOB: Aug 17, 1970    ADMISSION DATE:  06/19/2014 CONSULTATION DATE:  06/24/2014  REFERRING MD :  [facility]  CHIEF COMPLAINT:  Tachycardia  INITIAL PRESENTATION:  43 y.o. 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  Recently admitted to [facility] (06/07/14).  Has had persistent tachycardia while at [facility].  [facility] after husbands request for further evaluation of tachycardia.  In ED, UA suggestive of UTI.  STUDIES/SIGNIFICANT EVENTS: 10/12  Debridement of cervical abscess [facility]) 12/09  Admitted to [facility] 12/21  [facility]    SUBJECTIVE:  Intermittently follows commands.  VITAL SIGNS: Temp:  [98.6 F (37 C)-99.5 F (37.5 C)] 98.6 F (37 C) (12/26 0738) Pulse Rate:  [52-107] 107 (12/26 0910) Resp:  [18-27] 27 (12/26 0910) BP: (88-113)/(42-77) 96/62 mmHg (12/26 0910) SpO2:  [94 %-100 %] 100 % (12/26 0910) FiO2 (%):  [30 %] 30 % (12/26 0910) Weight:  [141 lb 15.6 oz (64.4 kg)] 141 lb 15.6 oz (64.4 kg) (12/26 0338) VENTILATOR SETTINGS: Vent Mode:  [-] PRVC FiO2 (%):  [30 %] 30 % Set Rate:  [15 bmp] 15 bmp Vt Set:  [450 mL] 450 mL PEEP:  [5 cmH20] 5 cmH20 Plateau Pressure:  [22 cmH20-29 cmH20] 22 cmH20 INTAKE / OUTPUT: Intake/Output      12/25 0701 - 12/26 0700 12/26 0701 - 12/27 0700   I.V. (mL/kg) 205 (3.2) 10 (0.2)   Other 210    NG/GT 1320 55   IV Piggyback 700    Total Intake(mL/kg) 2435 (37.8) 65 (1)   Urine (mL/kg/hr) 2290 (1.5)    Total Output 2290     Net +145 +65        Stool Occurrence 1 x      PHYSICAL EXAMINATION: General: no distress Neuro: will stick out tongue on commad HEENT: trach site clean Cardiovascular: regular, tachycardic Lungs: even/non-labored, scattered rhonchi Abdomen: soft, non tender, PEG site clean Ext: 1+ edema   LABS: CBC Recent Labs     06/22/14  0420  06/24/14  0350  WBC  12.7*  11.7*  HGB   9.4*  9.2*  HCT  29.7*  29.0*  PLT  256  258    BMET Recent Labs     06/22/14  0420  06/23/14  0545  06/24/14  0350  NA  130*  135  135  K  3.3*  3.4*  3.8  CL  98  103  103  CO2  24  21  22   BUN  8  9  11   CREATININE  <0.30*  <0.30*  <0.30*  GLUCOSE  136*  124*  129*    Electrolytes Recent Labs     06/22/14  0420  06/23/14  0545  06/24/14  0350  CALCIUM  9.0  9.3  9.4  MG   --   1.9  1.9   Liver Enzymes No results for input(s): AST, ALT, ALKPHOS, BILITOT, ALBUMIN in the last 72 hours.  Glucose Recent Labs     06/23/14  0032  06/23/14  0747  06/23/14  1520  06/23/14  2113  06/24/14  0021  06/24/14  0717  GLUCAP  156*  122*  121*  126*  164*  124*    Imaging No results found.     ASSESSMENT / PLAN:  PULMONARY INDWELLING DEVICES:: Trach (chronic) A: Acute on chronic respiratory failure from  PNA. Small L Effusion  S/p tracheostomy. P:   Full vent support >> she is not able to tolerate vent weaning F/u CXR intermittently Husband wants to take her home but not sure this is best option as he is wheelchair bound  CARDIOVASCULAR RUE PICC (? Insertion date) >>  A:  Sinus tachycardia. Chronic hypotension. P:  Continue lopressor Continue midodrine  RENAL A:   Hypokalemia. P:   Trend BMP Replace electrolytes as indicated   GASTROINTESTINAL PEG (chronic) >>  A:   Protein calorie malnutrition s/p PEG tube. P:   Pepcid for SUP Continue tube feeds  HEMATOLOGIC A:   Anemia of chronic disease. Hx UE DVT - not on anticoagulation. P:  F/u CBC intermittently SQ heparin for DVT prevention  INFECTIOUS A:   Serratia HCAP. Chronic cervical osteomyelitis and diskitis - on dicloxacillin through 08/14/14. Enterococcal bacteremia. P:   Resp 12/22 >> Serratia >> R cefazolin, S otherwise Blood 12/22 >> Enterococcus 1/2 >>  Cipro 12/21 >>  Vancomycin 12/24 >> Dicloxacillin (long term for MSSA epidural abscess) >> 08/14/14  Await cultures   Follow fever curve / leukocytosis  If BC only positive for 1/2 with enterococcus, would not consider TEE.  Await results  ENDOCRINE A:   Hyperglycemia P:   Monitor blood sugars while on tube feeds  NEUROLOGIC A:   Quadreplegia.  P:   Monitor clinically Supportive care    No family available on am rounds 12/26.     Canary BrimBrandi Ollis, NP-C [place] [facility] Pgr: [phone] or [phone]    Reviewed above and examined.  No fever.  She follows commands intermittently >> localizes when spoken too.  Unable to tolerate vent weaning.  Continue Abx as detailed by ID.  Will need to discuss with family about disposition >> concerned that having her d/c home might not be best option.  Coralyn HellingVineet Zykira Matlack, MD [facility] 06/24/2014, 10:41 AM Pager:  [phone] After 3pm call: [phone]

## 2014-06-25 ENCOUNTER — Inpatient Hospital Stay (HOSPITAL_COMMUNITY): Payer: Medicare Other

## 2014-06-25 LAB — BASIC METABOLIC PANEL
Anion gap: 9 (ref 5–15)
BUN: 9 mg/dL (ref 6–23)
CALCIUM: 9 mg/dL (ref 8.4–10.5)
CHLORIDE: 100 meq/L (ref 96–112)
CO2: 22 mmol/L (ref 19–32)
Glucose, Bld: 121 mg/dL — ABNORMAL HIGH (ref 70–99)
Potassium: 3.5 mmol/L (ref 3.5–5.1)
Sodium: 131 mmol/L — ABNORMAL LOW (ref 135–145)

## 2014-06-25 LAB — GLUCOSE, CAPILLARY
Glucose-Capillary: 128 mg/dL — ABNORMAL HIGH (ref 70–99)
Glucose-Capillary: 129 mg/dL — ABNORMAL HIGH (ref 70–99)
Glucose-Capillary: 131 mg/dL — ABNORMAL HIGH (ref 70–99)

## 2014-06-25 LAB — CBC
HCT: 29.8 % — ABNORMAL LOW (ref 36.0–46.0)
Hemoglobin: 9.4 g/dL — ABNORMAL LOW (ref 12.0–15.0)
MCH: 29.2 pg (ref 26.0–34.0)
MCHC: 31.5 g/dL (ref 30.0–36.0)
MCV: 92.5 fL (ref 78.0–100.0)
Platelets: 277 10*3/uL (ref 150–400)
RBC: 3.22 MIL/uL — ABNORMAL LOW (ref 3.87–5.11)
RDW: 14.7 % (ref 11.5–15.5)
WBC: 14.4 10*3/uL — ABNORMAL HIGH (ref 4.0–10.5)

## 2014-06-25 LAB — VANCOMYCIN, TROUGH: Vancomycin Tr: 7.3 ug/mL — ABNORMAL LOW (ref 10.0–20.0)

## 2014-06-25 MED ORDER — VANCOMYCIN HCL IN DEXTROSE 1-5 GM/200ML-% IV SOLN
1000.0000 mg | Freq: Three times a day (TID) | INTRAVENOUS | Status: DC
  Administered 2014-06-25 – 2014-07-04 (×28): 1000 mg via INTRAVENOUS
  Filled 2014-06-25 (×31): qty 200

## 2014-06-25 MED ORDER — SODIUM CHLORIDE 0.9 % IV SOLN
INTRAVENOUS | Status: DC
  Administered 2014-07-05 – 2014-07-14 (×6): via INTRAVENOUS

## 2014-06-25 MED ORDER — ACETAMINOPHEN 160 MG/5ML PO SOLN
650.0000 mg | Freq: Four times a day (QID) | ORAL | Status: DC | PRN
  Administered 2014-06-26 – 2014-07-21 (×13): 650 mg
  Filled 2014-06-25 (×14): qty 20.3

## 2014-06-25 MED ORDER — POTASSIUM CHLORIDE 20 MEQ/15ML (10%) PO SOLN
20.0000 meq | Freq: Once | ORAL | Status: AC
  Administered 2014-06-25: 20 meq
  Filled 2014-06-25: qty 15

## 2014-06-25 NOTE — Progress Notes (Signed)
ANTIBIOTIC CONSULT NOTE   Pharmacy Consult:  Vancomycin Indication:  Enterococcus Bacteremia  Allergies  Allergen Reactions  . Tape Rash   Patient Measurements: Height: 5' (152.4 cm) Weight: 144 lb 6.4 oz (65.5 kg) IBW/kg (Calculated) : 45.5  Vital Signs: Temp: 100.1 F (37.8 C) (12/27 1134) Temp Source: Oral (12/27 1134) BP: 114/68 mmHg (12/27 1248) Pulse Rate: 101 (12/27 1248) Intake/Output from previous day: 12/26 0701 - 12/27 0700 In: 2032.2 [I.V.:287.2; NG/GT:1155; IV Piggyback:350] Out: 1200 [Urine:1200] Intake/Output from this shift: Total I/O In: 150 [I.V.:40; NG/GT:110] Out: 60 [Urine:60]  Recent Labs  06/23/14 0545 06/24/14 0350 06/25/14 0400  WBC  --  11.7* 14.4*  HGB  --  9.2* 9.4*  PLT  --  258 277  CREATININE <0.30* <0.30* <0.30*   Microbiology: Recent Results (from the past 720 hour(s))  Urine culture     Status: None   Collection Time: 06/19/14  8:13 PM  Result Value Ref Range Status   Specimen Description URINE, CATHETERIZED  Final   Special Requests NONE  Final   Culture  Setup Time   Final    06/20/2014 04:19 Performed at [facility]    Colony Count   Final    30,000 COLONIES/ML Performed at [facility]    Culture   Final    Multiple bacterial morphotypes present, none predominant. Suggest appropriate recollection if clinically indicated. Performed at [facility]    Report Status 06/21/2014 FINAL  Final  MRSA PCR Screening     Status: None   Collection Time: 06/20/14 12:20 AM  Result Value Ref Range Status   MRSA by PCR NEGATIVE NEGATIVE Final    Comment:        The GeneXpert MRSA Assay (FDA approved for NASAL specimens only), is one component of a comprehensive MRSA colonization surveillance program. It is not intended to diagnose MRSA infection nor to guide or monitor treatment for MRSA infections.   Culture, blood (routine x 2)     Status: None (Preliminary result)   Collection Time: 06/20/14   1:09 AM  Result Value Ref Range Status   Specimen Description BLOOD LEFT HAND  Final   Special Requests BOTTLES DRAWN AEROBIC AND ANAEROBIC 5CC EA  Final   Culture  Setup Time   Final    06/20/2014 04:50 Performed at [facility]    Culture   Final    ENTEROCOCCUS SPECIES Note: Gram Stain Report Called to,Read Back By and Verified With: Tory EmeraldMICHELLE TOLER RN [id number] Performed at [facility]    Report Status PENDING  Incomplete  Culture, blood (routine x 2)     Status: None (Preliminary result)   Collection Time: 06/20/14  1:16 AM  Result Value Ref Range Status   Specimen Description BLOOD RIGHT HAND  Final   Special Requests BOTTLES DRAWN AEROBIC AND ANAEROBIC 5CC EA  Final   Culture  Setup Time   Final    06/20/2014 04:47 Performed at [facility]    Culture   Final           BLOOD CULTURE RECEIVED NO GROWTH TO DATE CULTURE WILL BE HELD FOR 5 DAYS BEFORE ISSUING A FINAL NEGATIVE REPORT Performed at [facility]    Report Status PENDING  Incomplete  Culture, respiratory (NON-Expectorated)     Status: None   Collection Time: 06/20/14  1:18 AM  Result Value Ref Range Status   Specimen Description TRACHEAL ASPIRATE  Final   Special Requests NONE  Final   Gram Stain   Final    MODERATE WBC PRESENT, PREDOMINANTLY PMN RARE SQUAMOUS EPITHELIAL CELLS PRESENT RARE GRAM NEGATIVE RODS Performed at [facility]    Culture   Final    ABUNDANT SERRATIA MARCESCENS Performed at [facility]    Report Status 06/22/2014 FINAL  Final   Organism ID, Bacteria SERRATIA MARCESCENS  Final      Susceptibility   Serratia marcescens - MIC*    CEFAZOLIN >=64 RESISTANT Resistant     CEFEPIME <=1 SENSITIVE Sensitive     CEFTAZIDIME <=1 SENSITIVE Sensitive     CEFTRIAXONE <=1 SENSITIVE Sensitive     CIPROFLOXACIN <=0.25 SENSITIVE Sensitive     GENTAMICIN <=1 SENSITIVE Sensitive     TOBRAMYCIN <=1 SENSITIVE Sensitive     TRIMETH/SULFA <=20 SENSITIVE  Sensitive     * ABUNDANT SERRATIA MARCESCENS   Medical History: Past Medical History  Diagnosis Date  . Osteomyelitis   . Epidural abscess   . Respiratory failure   . Metabolic encephalopathy   . Nonischemic cardiomyopathy   . Hypotension   . Osteomyelitis of vertebra of cervical region   . Bipolar 1 disorder   . Anxiety disorder   . Hypoxemia   . Bacteremia   . Hypoxic ischemic encephalopathy (HIE)   . Cutaneous abscess of back excluding buttocks   . Quadriplegia   . Dysphagia, oropharyngeal phase   . Tracheostomy in place    Assessment: [age] YOF admitted from [facility] for evaluation of tachycardia.  She has a complicated infectious history and is continued on dicloxacillin from PTA for history of cervical hardware/osteomyelitis. She is currently on Vancomycin 750 mg IV Q 12 hours and dicloxacillin. Cipro treatment complete for serratia in trach aspirate. WBC has trended up to 14.4 today with Tm 101.75F. A Vanc trough was sub-therapeutic today at 7.3.   Cipro 12/22 >>12/27 Cefepime 12/23 >>12/24 Dicloxacillin PTA 12/15 >> 08/14/14 Fluc 12/22 >> 12/23 Vancomycin 12/24 >>  12/22 TA cx - Serratia Marcescens 12/21 BCx x2 - 1/2 Enterococcus 12/21 UCx - negative  Plan:  - Increase Vancomycin to 1 g IV q 8 hours  - Monitor renal fxn, clinical progress, micro data [facility]  Vinnie LevelBenjamin Tonimarie Gritz, PharmD., BCPS Clinical Pharmacist Pager [phone]

## 2014-06-25 NOTE — Progress Notes (Signed)
PULMONARY / CRITICAL CARE MEDICINE   Name: Kathryn Booth MRN: [id number] DOB: [birth year]    ADMISSION DATE:  06/19/2014 CONSULTATION DATE:  06/25/2014  REFERR[facility] MD :  [facility]  CHIEF COMPLAINT:  Tachycardia  INITIAL PRESENTATION:  43 y.o. 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  Recently admitted to [facility] (06/07/14).  Has had persistent tachycardia while at [facility].  [facility] after husbands request for further evaluation of tachycardia.  In ED, UA suggestive of UTI.  STUDIES/SIGNIFICANT EVENTS: 10/12  Debridement of cervical abscess [facility] 12/09  Admitted to [facility] 12/21  [facility] with hx as above    SUBJECTIVE:  No acute events   VITAL SIGNS: Temp:  [98.8 F (37.1 C)-101.8 F (38.8 C)] 99.4 F (37.4 C) (12/27 0743) Pulse Rate:  [93-125] 103 (12/27 0700) Resp:  [15-31] 15 (12/27 0700) BP: (90-117)/(52-73) 94/59 mmHg (12/27 0700) SpO2:  [98 %-100 %] 100 % (12/27 0700) FiO2 (%):  [30 %] 30 % (12/27 0700) Weight:  [144 lb 6.4 oz (65.5 kg)] 144 lb 6.4 oz (65.5 kg) (12/27 0354)   VENTILATOR SETTINGS: Vent Mode:  [-] PRVC FiO2 (%):  [30 %] 30 % Set Rate:  [15 bmp] 15 bmp Vt Set:  [450 mL] 450 mL PEEP:  [5 cmH20] 5 cmH20 Plateau Pressure:  [22 cmH20-26 cmH20] 25 cmH20   INTAKE / OUTPUT: Intake/Output      12/26 0701 - 12/27 0700 12/27 0701 - 12/28 0700   I.V. (mL/kg) 287.2 (4.4)    Other 240    NG/GT 1155    IV Piggyback 350    Total Intake(mL/kg) 2032.2 (31)    Urine (mL/kg/hr) 1200 (0.8)    Total Output 1200     Net +832.2          Stool Occurrence 1 x      PHYSICAL EXAMINATION: General: no distress Neuro: will stick out tongue on command, tracks HEENT: trach site clean Cardiovascular: regular, tachycardic Lungs: even/non-labored, scattered rhonchi Abdomen: soft, non tender, PEG site clean Ext: 1+ edema   LABS: CBC Recent Labs     06/24/14  0350  06/25/14  0400  WBC  11.7*  14.4*  HGB  9.2*  9.4*   HCT  29.0*  29.8*  PLT  258  277    BMET Recent Labs     06/23/14  0545  06/24/14  0350  06/25/14  0400  NA  135  135  131*  K  3.4*  3.8  3.5  CL  103  103  100  CO2  21  22  22   BUN  9  11  9   CREATININE  <0.30*  <0.30*  <0.30*  GLUCOSE  124*  129*  121*    Electrolytes Recent Labs     06/23/14  0545  06/24/14  0350  06/25/14  0400  CALCIUM  9.3  9.4  9.0  MG  1.9  1.9   --    Glucose Recent Labs     06/23/14  2113  06/24/14  0021  06/24/14  0717  06/24/14  1537  06/25/14  0011  06/25/14  0720  GLUCAP  126*  164*  124*  120*  129*  128*    Imaging No results found.     ASSESSMENT / PLAN:  PULMONARY INDWELLING DEVICES: Trach (chronic) A: Acute on chronic respiratory failure from PNA. Small L Effusion  S/p tracheostomy. P:   Full support >>  she is not able to tolerate vent weaning F/u CXR intermittently Husband wants to take her home but not sure this is best option as he is wheelchair bound  CARDIOVASCULAR RUE PICC (? Insertion date) >>  A:  Sinus tachycardia. Chronic hypotension. P:  Continue lopressor Continue midodrine  RENAL A:   Hypokalemia. Hyponatremia  P:   Trend BMP Replace electrolytes as indicated  KCL x1 NS @ 50 ml/hr  GASTROINTESTINAL PEG (chronic) >>  A:   Protein calorie malnutrition s/p PEG tube. P:   Pepcid for SUP Continue tube feeds  HEMATOLOGIC A:   Anemia of chronic disease. Hx UE DVT - not on anticoagulation. P:  F/u CBC intermittently SQ heparin for DVT prevention  INFECTIOUS A:   Serratia HCAP. Chronic cervical osteomyelitis and diskitis - on dicloxacillin through 08/14/14. Enterococcal bacteremia. P:   Resp 12/22 >> Serratia >> R cefazolin, S otherwise Blood 12/22 >> Enterococcus 1/2 >>  Cipro 12/21 >>  Vancomycin 12/24 >> Dicloxacillin (long term for MSSA epidural abscess) >> 08/14/14  Await cultures  Follow fever curve / leukocytosis  If BC only positive for 1/2 with enterococcus,  would not consider TEE.  Await results Appreciate ID input   ENDOCRINE A:   Hyperglycemia P:   Monitor blood sugars while on tube feeds  NEUROLOGIC A:   Quadreplegia.  P:   Monitor clinically Supportive care    No family available on am rounds 12/27.     Canary BrimBrandi Ollis, NP-C [place] [facility] Pgr: [phone] or [phone]   Reviewed above, examined.  No fever.  Oxygen needs stable.  Decreased BS at [place].  More alert.  Continue Abx.  Will need to determine if she as support structure at home to take care of her needs >> if not, then might need placement in vent SNF.  Coralyn HellingVineet Hamsa Laurich, MD [facility] 06/25/2014, 2:28 PM Pager:  [phone] After 3pm call: [phone]

## 2014-06-26 ENCOUNTER — Inpatient Hospital Stay (HOSPITAL_COMMUNITY): Payer: Medicare Other

## 2014-06-26 LAB — GLUCOSE, CAPILLARY
GLUCOSE-CAPILLARY: 142 mg/dL — AB (ref 70–99)
GLUCOSE-CAPILLARY: 149 mg/dL — AB (ref 70–99)
Glucose-Capillary: 142 mg/dL — ABNORMAL HIGH (ref 70–99)
Glucose-Capillary: 148 mg/dL — ABNORMAL HIGH (ref 70–99)

## 2014-06-26 LAB — CBC
HEMATOCRIT: 32.9 % — AB (ref 36.0–46.0)
Hemoglobin: 10.5 g/dL — ABNORMAL LOW (ref 12.0–15.0)
MCH: 29.5 pg (ref 26.0–34.0)
MCHC: 31.9 g/dL (ref 30.0–36.0)
MCV: 92.4 fL (ref 78.0–100.0)
Platelets: 317 10*3/uL (ref 150–400)
RBC: 3.56 MIL/uL — AB (ref 3.87–5.11)
RDW: 14.7 % (ref 11.5–15.5)
WBC: 18.5 10*3/uL — AB (ref 4.0–10.5)

## 2014-06-26 LAB — VANCOMYCIN, TROUGH: Vancomycin Tr: 14.7 ug/mL (ref 10.0–20.0)

## 2014-06-26 LAB — PROCALCITONIN: Procalcitonin: 0.13 ng/mL

## 2014-06-26 LAB — BASIC METABOLIC PANEL
ANION GAP: 9 (ref 5–15)
BUN: 12 mg/dL (ref 6–23)
CO2: 21 mmol/L (ref 19–32)
Calcium: 9.3 mg/dL (ref 8.4–10.5)
Chloride: 105 mEq/L (ref 96–112)
Creatinine, Ser: <0.3 mg/dL — ABNORMAL LOW (ref 0.50–1.10)
Glucose, Bld: 142 mg/dL — ABNORMAL HIGH (ref 70–99)
POTASSIUM: 3.3 mmol/L — AB (ref 3.5–5.1)
SODIUM: 135 mmol/L (ref 135–145)

## 2014-06-26 LAB — LACTIC ACID, PLASMA: LACTIC ACID, VENOUS: 1.3 mmol/L (ref 0.5–2.2)

## 2014-06-26 MED ORDER — INFLUENZA VAC SPLIT QUAD 0.5 ML IM SUSY
0.5000 mL | PREFILLED_SYRINGE | INTRAMUSCULAR | Status: AC
  Administered 2014-06-27: 0.5 mL via INTRAMUSCULAR
  Filled 2014-06-26: qty 0.5

## 2014-06-26 MED ORDER — POTASSIUM CHLORIDE 20 MEQ/15ML (10%) PO SOLN
40.0000 meq | Freq: Once | ORAL | Status: AC
  Administered 2014-06-26: 40 meq via ORAL
  Filled 2014-06-26: qty 30

## 2014-06-26 MED ORDER — PNEUMOCOCCAL VAC POLYVALENT 25 MCG/0.5ML IJ INJ
0.5000 mL | INJECTION | INTRAMUSCULAR | Status: AC
  Administered 2014-06-27: 0.5 mL via INTRAMUSCULAR
  Filled 2014-06-26: qty 0.5

## 2014-06-26 NOTE — Progress Notes (Signed)
PULMONARY / CRITICAL CARE MEDICINE   Name: Kathryn Booth MRN: [id number] DOB: [birth year]    ADMISSION DATE:  06/19/2014 CONSULTATION DATE:  06/26/2014  REFERRING MD :  [facility]  CHIEF COMPLAINT:  Tachycardia  INITIAL PRESENTATION:  43 y.o. 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  Recently admitted to [facility] (06/07/14).  Has had persistent tachycardia while at [facility].  [facility] after husbands request for further evaluation of tachycardia.  In ED, UA suggestive of UTI.  STUDIES/SIGNIFICANT EVENTS: 10/12  Debridement of cervical abscess [facility] 12/09  Admitted to [facility] 12/21  [facility] as above 12/27 : :  No acute events    SUBJECTIVE/OVERNIGHT/INTERVAL HX 06/26/14: Husband definitely wants to take patient home. Care manager says things are being set up. Temp now 102.27F -- ongoing fevers 101-103F. Controlled wit tylenol.   VITAL SIGNS: Temp:  [99.2 F (37.3 C)-102.6 F (39.2 C)] 102 F (38.9 C) (12/28 1214) Pulse Rate:  [97-139] 130 (12/28 1300) Resp:  [20-30] 21 (12/28 1300) BP: (74-116)/(50-72) 92/50 mmHg (12/28 1300) SpO2:  [99 %-100 %] 100 % (12/28 1300) FiO2 (%):  [30 %] 30 % (12/28 1200) Weight:  [61.8 kg (136 lb 3.9 oz)] 61.8 kg (136 lb 3.9 oz) (12/28 0500)   VENTILATOR SETTINGS: Vent Mode:  [-] PRVC FiO2 (%):  [30 %] 30 % Set Rate:  [15 bmp] 15 bmp Vt Set:  [450 mL] 450 mL PEEP:  [5 cmH20] 5 cmH20 Plateau Pressure:  [20 cmH20-25 cmH20] 20 cmH20   INTAKE / OUTPUT: Intake/Output      12/27 0701 - 12/28 0700 12/28 0701 - 12/29 0700   I.V. (mL/kg) 1110 (18) 260 (4.2)   Other 125 150   NG/GT 1265 330   IV Piggyback 600 500   Total Intake(mL/kg) 3100 (50.2) 1240 (20.1)   Urine (mL/kg/hr) 2705 (1.8) 410 (1)   Total Output 2705 410   Net +395 +830        Stool Occurrence 1 x      PHYSICAL EXAMINATION: General: no distress Neuro: will stick out tongue on command, tracks HEENT: trach site  clean Cardiovascular: regular, tachycardic Lungs: even/non-labored, scattered rhonchi Abdomen: soft, non tender, PEG site clean Ext: 1+ edema   LABS: PULMONARY  Recent Labs Lab 06/19/14 2035  PHART 7.398  PCO2ART 60.2*  PO2ART 80.0  HCO3 37.1*  TCO2 39  O2SAT 95.0    CBC  Recent Labs Lab 06/24/14 0350 06/25/14 0400 06/26/14 0511  HGB 9.2* 9.4* 10.5*  HCT 29.0* 29.8* 32.9*  WBC 11.7* 14.4* 18.5*  PLT 258 277 317    COAGULATION No results for input(s): INR in the last 168 hours.  CARDIAC  No results for input(s): TROPONINI in the last 168 hours. No results for input(s): PROBNP in the last 168 hours.   CHEMISTRY  Recent Labs Lab 06/20/14 0330 06/21/14 0445  06/22/14 0420 06/23/14 0545 06/24/14 0350 06/25/14 0400 06/26/14 0511  NA 135 132*  < > 130* 135 135 131* 135  K 4.0 2.5*  < > 3.3* 3.4* 3.8 3.5 3.3*  CL 98 96  < > 98 103 103 100 105  CO2 31 28  < > 24 21 22 22 21   GLUCOSE 106* 130*  < > 136* 124* 129* 121* 142*  BUN 8 10  < > 8 9 11 9 12   CREATININE <0.30* <0.30*  < > <0.30* <0.30* <0.30* <0.30* <0.30*  CALCIUM 8.9 8.8  < >  9.0 9.3 9.4 9.0 9.3  MG 1.6 1.7  --   --  1.9 1.9  --   --   PHOS 3.8  --   --   --   --   --   --   --   < > = values in this interval not displayed. CrCl cannot be calculated (This lab value for creatinine cannot be used to calculate CrCl because it is not a number: <0.30).   LIVER  Recent Labs Lab 06/19/14 2014 06/21/14 0445  AST 30 34  ALT 79* 66*  ALKPHOS 96 62  BILITOT 0.2* 0.6  PROT 7.7 5.8*  ALBUMIN 2.8* 2.4*     INFECTIOUS  Recent Labs Lab 06/19/14 2022  LATICACIDVEN 1.24     ENDOCRINE CBG (last 3)   Recent Labs  06/26/14 0001 06/26/14 0404 06/26/14 0800  GLUCAP 142* 148* 142*         IMAGING x48h Dg Chest Port 1 View  06/25/2014   CLINICAL DATA:  Acute respiratory failure, history nonischemic cardiomyopathy, encephalopathy, quadriplegia  EXAM: PORTABLE CHEST - 1 VIEW   COMPARISON:  Portable exam 0557 hr compared to 06/22/2014  FINDINGS: Tracheostomy tube stable tip projecting 3.1 cm above carina.  LEFT pulmonary arterial stent.  Enlargement of cardiac silhouette.  Persistent consolidation of LEFT lower lobe with suspect minimal infiltrate within lingula as well.  Bronchitic changes with mild RIGHT basilar atelectasis.  No gross pneumothorax.  IMPRESSION: Persistent LEFT lower lobe consolidation with question minimal lingular infiltrate.  Bronchitic changes with mild RIGHT basilar atelectasis.   Electronically Signed   By: Ulyses Southward M.D.   On: 06/25/2014 08:56        ASSESSMENT / PLAN:  PULMONARY INDWELLING DEVICES: Trach (chronic) A: Acute on chronic respiratory failure from PNA. Small L Effusion  S/p tracheostomy   - on full vent support  P:   Full support >> she is not able to tolerate vent weaning F/u CXR intermittently Husband wants to take her home but not sure this is best option as he is wheelchair bound and family disagrees with him  CARDIOVASCULAR RUE PICC (? Insertion date) >>  A:  Sinus tachycardia. Chronic hypotension.  -currently map 71 wit HR 120 P:  Continue lopressor Continue midodrine  RENAL A:   Hypokalemia.  P:   Trend BMP Replace electrolytes as indicated  KCL x1 x NS @ 50 ml/hr  GASTROINTESTINAL PEG (chronic) >>  A:   Protein calorie malnutrition s/p PEG tube. P:   Pepcid for SUP Continue tube feeds  HEMATOLOGIC A:   Anemia of chronic disease. Hx UE DVT - not on anticoagulation. P:  F/u CBC intermittently SQ heparin for DVT prevention  INFECTIOUS A:   Serratia HCAP. Chronic cervical osteomyelitis and diskitis - on dicloxacillin through 08/14/14. Enterococcal bacteremia.   - 12/28 - ongoing fevers + P:   Resp 12/22 >> Serratia >> R cefazolin, S otherwise Blood 12/22 >> Enterococcus 1/2 >>  Cipro 12/21 >>  Vancomycin 12/24 >> Dicloxacillin (long term for MSSA epidural abscess) >>  08/14/14  Recheck sepsis biomarkers 06/26/14 Cooling blanket for family and patient palliation of fevers Get CT chest rule out empyema left side Await cultures  Follow fever curve / leukocytosis  If BC only positive for 1/2 with enterococcus, would not consider TEE.  Await results Appreciate ID input whho signed off 06/23/14  ENDOCRINE A:   Hyperglycemia P:   Monitor blood sugars while on tube feeds  NEUROLOGIC A:  Debbora DusQuadreplegia.  P:   Monitor clinically Supportive care     CODE     Code Status Orders        Start     Ordered   06/19/14 2215  Full code   Continuous     06/19/14 2218       FAMILY Updates 12/28: sister updated. She prefers patient stays at hospital till vent off. She disagrees with husband who wants to take patient home. Awaiting SDU 2C bed     Dr. Kalman ShanMurali Jamicah Anstead, M.D., [facility].P Pulmonary and Critical Care Medicine Staff Physician  System [facility] Pager: [phone], If no answer or between  15:00h - 7:00h: call [phone]  06/26/2014 2:04 PM

## 2014-06-26 NOTE — Progress Notes (Addendum)
ANTIBIOTIC CONSULT NOTE   Pharmacy Consult:  Vancomycin Indication:  Enterococcus Bacteremia  Allergies  Allergen Reactions  . Tape Rash   Patient Measurements: Height: 5' (152.4 cm) Weight: 136 lb 3.9 oz (61.8 kg) IBW/kg (Calculated) : 45.5  Vital Signs: Temp: 100.2 F (37.9 C) (12/28 1840) Temp Source: Rectal (12/28 1840) BP: 102/72 mmHg (12/28 2100) Pulse Rate: 104 (12/28 2100) Intake/Output from previous day: 12/27 0701 - 12/28 0700 In: 3100 [I.V.:1110; NG/GT:1265; IV Piggyback:600] Out: 2705 [Urine:2705] Intake/Output from this shift: Total I/O In: 300 [I.V.:180; Other:120] Out: 50 [Urine:50]  Recent Labs  06/24/14 0350 06/25/14 0400 06/26/14 0511  WBC 11.7* 14.4* 18.5*  HGB 9.2* 9.4* 10.5*  PLT 258 277 317  CREATININE <0.30* <0.30* <0.30*   Microbiology: Recent Results (from the past 720 hour(s))  Urine culture     Status: None   Collection Time: 06/19/14  8:13 PM  Result Value Ref Range Status   Specimen Description URINE, CATHETERIZED  Final   Special Requests NONE  Final   Culture  Setup Time   Final    06/20/2014 04:19 Performed at [facility]    Colony Count   Final    30,000 COLONIES/ML Performed at [facility]    Culture   Final    Multiple bacterial morphotypes present, none predominant. Suggest appropriate recollection if clinically indicated. Performed at [facility]    Report Status 06/21/2014 FINAL  Final  MRSA PCR Screening     Status: None   Collection Time: 06/20/14 12:20 AM  Result Value Ref Range Status   MRSA by PCR NEGATIVE NEGATIVE Final    Comment:        The GeneXpert MRSA Assay (FDA approved for NASAL specimens only), is one component of a comprehensive MRSA colonization surveillance program. It is not intended to diagnose MRSA infection nor to guide or monitor treatment for MRSA infections.   Culture, blood (routine x 2)     Status: None (Preliminary result)   Collection Time: 06/20/14   1:09 AM  Result Value Ref Range Status   Specimen Description BLOOD LEFT HAND  Final   Special Requests BOTTLES DRAWN AEROBIC AND ANAEROBIC 5CC EA  Final   Culture  Setup Time   Final    06/20/2014 04:50 Performed at [facility]    Culture   Final    ENTEROCOCCUS SPECIES Note: Gram Stain Report Called to,Read Back By and Verified With: Tory EmeraldMICHELLE TOLER RN [id number] Performed at [facility]    Report Status PENDING  Incomplete  Culture, blood (routine x 2)     Status: None (Preliminary result)   Collection Time: 06/20/14  1:16 AM  Result Value Ref Range Status   Specimen Description BLOOD RIGHT HAND  Final   Special Requests BOTTLES DRAWN AEROBIC AND ANAEROBIC 5CC EA  Final   Culture  Setup Time   Final    06/20/2014 04:47 Performed at [facility]    Culture   Final           BLOOD CULTURE RECEIVED NO GROWTH TO DATE CULTURE WILL BE HELD FOR 5 DAYS BEFORE ISSUING A FINAL NEGATIVE REPORT Performed at [facility]    Report Status PENDING  Incomplete  Culture, respiratory (NON-Expectorated)     Status: None   Collection Time: 06/20/14  1:18 AM  Result Value Ref Range Status   Specimen Description TRACHEAL ASPIRATE  Final   Special Requests NONE  Final   Gram Stain  Final    MODERATE WBC PRESENT, PREDOMINANTLY PMN RARE SQUAMOUS EPITHELIAL CELLS PRESENT RARE GRAM NEGATIVE RODS Performed at [facility]    Culture   Final    ABUNDANT SERRATIA MARCESCENS Performed at [facility]    Report Status 06/22/2014 FINAL  Final   Organism ID, Bacteria SERRATIA MARCESCENS  Final      Susceptibility   Serratia marcescens - MIC*    CEFAZOLIN >=64 RESISTANT Resistant     CEFEPIME <=1 SENSITIVE Sensitive     CEFTAZIDIME <=1 SENSITIVE Sensitive     CEFTRIAXONE <=1 SENSITIVE Sensitive     CIPROFLOXACIN <=0.25 SENSITIVE Sensitive     GENTAMICIN <=1 SENSITIVE Sensitive     TOBRAMYCIN <=1 SENSITIVE Sensitive     TRIMETH/SULFA <=20 SENSITIVE  Sensitive     * ABUNDANT SERRATIA MARCESCENS   Medical History: Past Medical History  Diagnosis Date  . Osteomyelitis   . Epidural abscess   . Respiratory failure   . Metabolic encephalopathy   . Nonischemic cardiomyopathy   . Hypotension   . Osteomyelitis of vertebra of cervical region   . Bipolar 1 disorder   . Anxiety disorder   . Hypoxemia   . Bacteremia   . Hypoxic ischemic encephalopathy (HIE)   . Cutaneous abscess of back excluding buttocks   . Quadriplegia   . Dysphagia, oropharyngeal phase   . Tracheostomy in place    Assessment: [age] YOF admitted from [facility] for evaluation of tachycardia.  She has a complicated infectious history and is continued on dicloxacillin from PTA for history of cervical hardware/osteomyelitis. She is currently on Vancomycin 1 g IV Q 12 hours and dicloxacillin. Cipro treatment complete for serratia in trach aspirate. Patient remains febrile and WBC climbing. Vancomycin trough was reported as 14.7 on 1g IV q8h  Cipro 12/22 >>12/27 Cefepime 12/23 >>12/24 Dicloxacillin PTA 12/15 >> 08/14/14 Fluc 12/22 >> 12/23 Vancomycin 12/24 >> (2 wks per ID)  12/22 TA cx - Serratia Marcescens 12/21 BCx x2 - 1/2 Enterococcus 12/21 UCx - negative 12/28 cdiff pending  Plan:  Continue Vancomycin to 1 g IV q 8 hours  Monitor renal fxn, clinical progress, micro data, follow up CDiff result Follow up VT in 1 week unless otherwise clinically indicated  Thank you for allowing pharmacy to be a part of this patients care team.  Lovenia KimJulie Domenique Quest Pharm.D., BCPS, AQ-Cardiology Clinical Pharmacist 06/26/2014 9:56 PM Pager: [phone] Phone: [phone]

## 2014-06-26 NOTE — Progress Notes (Signed)
NUTRITION FOLLOW UP  Intervention:    Continue TF via PEG with Vital AF 1.2 at 55 ml/h (1320 ml/day) providing 1584 kcals, 99 gm protein, 1071 ml free water daily.   Nutrition Dx:   Inadequate oral intake related to inability to eat as evidenced by NPO status. Ongoing.  Goal:   Intake to meet >90% of estimated nutrition needs. Met.  Monitor:   TF tolerance/adequacy, weight trend, labs, vent status.  Assessment:   43 y.o. 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 Recently admitted to [facility] (06/07/14). Has had persistent tachycardia while at [facility]. Brought to [facility] ED 12/21 after husbands request for further evaluation of tachycardia. In ED, UA suggestive of UTI.  Patient is receiving TF via PEG with Vital AF 1.2 at 55 ml/h (1320 ml/day) providing 1584 kcals, 99 gm protein, 1071 ml free water daily. Tolerating TF well at goal rate with minimal residuals </= 55 ml for the past day, 0 ml this AM.  Patient is currently intubated on ventilator support MV: 10.2 L/min Temp (24hrs), Avg:100.6 F (38.1 C), Min:99.2 F (37.3 C), Max:101.1 F (38.4 C)  Propofol: none  Height: Ht Readings from Last 1 Encounters:  06/20/14 5' (1.524 m)    Weight Status:   Wt Readings from Last 1 Encounters:  06/26/14 136 lb 3.9 oz (61.8 kg)    Re-estimated needs:  Kcal: 1675 Protein: 85-100 gm Fluid: 1.6-1.8 L  Skin: intact  Diet Order: Diet NPO time specified   Intake/Output Summary (Last 24 hours) at 06/26/14 0919 Last data filed at 06/26/14 0900  Gross per 24 hour  Intake   3245 ml  Output   2770 ml  Net    475 ml    Last BM: 12/28   Labs:   Recent Labs Lab 06/20/14 0330 06/21/14 0445  06/23/14 0545 06/24/14 0350 06/25/14 0400 06/26/14 0511  NA 135 132*  < > 135 135 131* 135  K 4.0 2.5*  < > 3.4* 3.8 3.5 3.3*  CL 98 96  < > 103 103 100 105  CO2 31 28  < > _0 BUN 8 10  < > _1 CREATININE <0.30* <0.30*  < > <0.30* <0.30*  <0.30* <0.30*  CALCIUM 8.9 8.8  < > 9.3 9.4 9.0 9.3  MG 1.6 1.7  --  1.9 1.9  --   --   PHOS 3.8  --   --   --   --   --   --   GLUCOSE 106* 130*  < > 124* 129* 121* 142*  < > = values in this interval not displayed.  CBG (last 3)   Recent Labs  06/26/14 0001 06/26/14 0404 06/26/14 0800  GLUCAP 142* 148* 142*    Scheduled Meds: . antiseptic oral rinse  7 mL Mouth Rinse QID  . chlorhexidine  15 mL Mouth Rinse BID  . dicloxacillin  500 mg Per Tube 3 times per day  . famotidine  20 mg Per Tube BID  . heparin  5,000 Units Subcutaneous 3 times per day  . metoprolol tartrate  25 mg Per Tube Q8H  . midodrine  10 mg Per Tube TID WC  . vancomycin  1,000 mg Intravenous Q8H    Continuous Infusions: . sodium chloride 50 mL/hr at 06/25/14 1021  . feeding supplement (VITAL AF 1.2 CAL) 1,000 mL (06/24/14 1450)    Molli Barrows, RD, LDN, [place] Pager [phone] After Hours  Pager [phone]

## 2014-06-26 NOTE — Progress Notes (Signed)
Pt. transported to/from CT/2M07 uneventfully, RT to monitor.

## 2014-06-26 NOTE — Care Management Note (Signed)
Page 1 of 2   06/26/2014     1:00:55 PM CARE MANAGEMENT NOTE 06/26/2014  Patient:  Kathryn DavidsonHARRIS,Nasha   Account Number:  [id number]  Date Initiated:  06/20/2014  Documentation initiated by:  [facility]OWN,Deavin Forst  Subjective/Objective Assessment:   Admitted from [facility] - with UTI - continued tachycardia.  Chronic vent patient.     Action/Plan:   Anticipated DC Date:  06/21/2014   Anticipated DC Plan:  SKILLED NURSING FACILITY  In-house referral  Clinical Social Worker      DC Planning Services  CM consult      Choice offered to / List presented to:             [facility]  OTHER - SEE NOTE   Status of service:  In process, will continue to follow Medicare Important Message given?   (If response is "NO", the following Medicare IM given date fields will be blank) Date Medicare IM given:   Medicare IM given by:   Date Additional Medicare IM given:   Additional Medicare IM given by:    Discharge Disposition:    Per UR Regulation:  Reviewed for med. necessity/level of care/duration of stay  If discussed at Long Length of Stay Meetings, dates discussed:    Comments:  Contact:  Aranas,Cornel Other [phone]  06-26-14 12:30pm Avie ArenasSarah Lauris Keepers, RNBSN - [phone] [facility]. She is a "brain injury, quadriparesis" Patient. There are 3 trainable Caregivers. Her Husband who mobilizes by way of an electric WC and goes to dialysis Mon., Wed.,and Fri. from 6-11AM, a Sister who lives in [place] and a Friend who lives " a couple of blocks away". Will need orders for: oxygen and Vent settings are Pressure Regulated/Volume Control, Rate=15, VT=450, Peep=+5 and 30% O2. She has a cuffed Shiley 6.0 XLT Proximal trach tube with inner cannulas, 14 French closed Suction catheters, enteral feedings of Vital 1.2 with continuous pump at a rate of 55 and an indwelling foley catheter. Will also need Hospital bed - do we need nebulizer and  meds????  Home evaluation passes after husband obtains flashlights and a Government social research officerfire extinguisher. [employer] will do the Nursing at 24 hours initially then later 16 hours a day.     06-21-14  2:40pm Avie ArenasSarah Chaneka Trefz, RNBSN [phone]- [phone] Updated that husband does not want patient to go [facility] to [facility] - wants to take her home.  Husband here with patient.  SW, Physician and myself went in to talk with husband.  Confirmed that husband does want to take patient home - this process has been started at [facility] and they were hoping to get her back home by the end of the year. Plan now to continue this process and tx patient to intermediate unit. Contact [facility] - Home assessment and caregiver assessment for home vent has been done  and appoved. two care givers have been checked off on trach care - will call back with names. [facility] Remer MachoMarc Lewis at [facility] will call back with exactly what was ordered. HHRN has been set up with [employer] through Adele BarthelWendy Fields - at [phone]. The Private Duty Request through state has not been signed by physician to send in - will fax over and will place on chart for our physician to sign.  Toniann FailWendy will fax out. Feels is will be no problem to getting this assistance. Will need a caregiver - husband to stay  the night with the home vent.  Plan was for home vent to be set up first of next week.

## 2014-06-27 ENCOUNTER — Inpatient Hospital Stay (HOSPITAL_COMMUNITY): Payer: Medicare Other

## 2014-06-27 DIAGNOSIS — A4181 Sepsis due to Enterococcus: Secondary | ICD-10-CM | POA: Diagnosis not present

## 2014-06-27 LAB — GLUCOSE, CAPILLARY
Glucose-Capillary: 127 mg/dL — ABNORMAL HIGH (ref 70–99)
Glucose-Capillary: 138 mg/dL — ABNORMAL HIGH (ref 70–99)
Glucose-Capillary: 149 mg/dL — ABNORMAL HIGH (ref 70–99)

## 2014-06-27 LAB — CBC WITH DIFFERENTIAL/PLATELET
BASOS ABS: 0 10*3/uL (ref 0.0–0.1)
BASOS PCT: 0 % (ref 0–1)
Eosinophils Absolute: 0.1 10*3/uL (ref 0.0–0.7)
Eosinophils Relative: 0 % (ref 0–5)
HCT: 27.1 % — ABNORMAL LOW (ref 36.0–46.0)
Hemoglobin: 8.6 g/dL — ABNORMAL LOW (ref 12.0–15.0)
LYMPHS PCT: 8 % — AB (ref 12–46)
Lymphs Abs: 1.8 10*3/uL (ref 0.7–4.0)
MCH: 30.3 pg (ref 26.0–34.0)
MCHC: 31.7 g/dL (ref 30.0–36.0)
MCV: 95.4 fL (ref 78.0–100.0)
Monocytes Absolute: 1.5 10*3/uL — ABNORMAL HIGH (ref 0.1–1.0)
Monocytes Relative: 7 % (ref 3–12)
NEUTROS ABS: 18.7 10*3/uL — AB (ref 1.7–7.7)
NEUTROS PCT: 85 % — AB (ref 43–77)
Platelets: 225 10*3/uL (ref 150–400)
RBC: 2.84 MIL/uL — ABNORMAL LOW (ref 3.87–5.11)
RDW: 14.5 % (ref 11.5–15.5)
WBC: 22.1 10*3/uL — ABNORMAL HIGH (ref 4.0–10.5)

## 2014-06-27 LAB — BASIC METABOLIC PANEL
Anion gap: 9 (ref 5–15)
BUN: 11 mg/dL (ref 6–23)
CALCIUM: 8.4 mg/dL (ref 8.4–10.5)
CO2: 17 mmol/L — ABNORMAL LOW (ref 19–32)
Chloride: 110 mEq/L (ref 96–112)
Creatinine, Ser: <0.3 mg/dL — ABNORMAL LOW (ref 0.50–1.10)
Glucose, Bld: 170 mg/dL — ABNORMAL HIGH (ref 70–99)
POTASSIUM: 2.3 mmol/L — AB (ref 3.5–5.1)
Sodium: 136 mmol/L (ref 135–145)

## 2014-06-27 LAB — MAGNESIUM: MAGNESIUM: 1.5 mg/dL (ref 1.5–2.5)

## 2014-06-27 LAB — CLOSTRIDIUM DIFFICILE BY PCR: Toxigenic C. Difficile by PCR: POSITIVE — AB

## 2014-06-27 LAB — PHOSPHORUS: PHOSPHORUS: 2.9 mg/dL (ref 2.3–4.6)

## 2014-06-27 LAB — PROCALCITONIN: PROCALCITONIN: 0.1 ng/mL

## 2014-06-27 MED ORDER — VANCOMYCIN 50 MG/ML ORAL SOLUTION
125.0000 mg | Freq: Four times a day (QID) | ORAL | Status: DC
  Administered 2014-06-27 – 2014-06-28 (×4): 125 mg via ORAL
  Filled 2014-06-27 (×8): qty 2.5

## 2014-06-27 MED ORDER — FENTANYL 25 MCG/HR TD PT72
25.0000 ug | MEDICATED_PATCH | TRANSDERMAL | Status: DC
  Administered 2014-06-28 – 2014-07-10 (×5): 25 ug via TRANSDERMAL
  Filled 2014-06-27 (×5): qty 1

## 2014-06-27 MED ORDER — MAGNESIUM SULFATE 2 GM/50ML IV SOLN
2.0000 g | Freq: Once | INTRAVENOUS | Status: AC
  Administered 2014-06-27: 2 g via INTRAVENOUS
  Filled 2014-06-27: qty 50

## 2014-06-27 MED ORDER — POTASSIUM CHLORIDE 20 MEQ/15ML (10%) PO SOLN
40.0000 meq | ORAL | Status: AC
  Administered 2014-06-27 (×3): 40 meq
  Filled 2014-06-27 (×4): qty 30

## 2014-06-27 NOTE — Progress Notes (Signed)
eLink Physician-Brief Progress Note Patient Name: Kathryn DavidsonDana Harris DOB: 04-Aug-1970 MRN: [id number]   Date of Service  06/27/2014  HPI/Events of Note    eICU Interventions  Potassium replaced     Intervention Category Minor Interventions: Electrolytes abnormality - evaluation and management  Garion Wempe S. 06/27/2014, 5:06 AM

## 2014-06-27 NOTE — Progress Notes (Signed)
06/27/2014 pneumococcal  vaccine given in left deltoid at 1233. Lot #: [id number] and expire Nov 15, 2015. [facility] Sor[facility] RN.

## 2014-06-27 NOTE — Progress Notes (Signed)
PULMONARY / CRITICAL CARE MEDICINE   Name: Kathryn Booth MRN: [id number] DOB: 09/13/1970    ADMISSION DATE:  06/19/2014 CONSULTATION DATE:  06/27/2014  [facility]ING MD :  [facility]  CHIEF COMPLAINT:  Tachycardia  INITIAL PRESENTATION:  43 y.o. 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  Recently admitted to [facility] (06/07/14).  Has had persistent tachycardia while at [facility].  [facility] after husbands request for further evaluation of tachycardia.  In ED, UA suggestive of UTI.  STUDIES/SIGNIFICANT EVENTS: 10/12  Debridement of cervical abscess [facility] 12/09  Admitted to [facility] 12/21  [facility] as above 12/27 : :  No acute events  06/26/14: Husband definitely wants to take patient home. Care manager says things are being set up. Temp now 102.13F -- ongoing fevers 101-103F. Controlled wit tylenol.    SUBJECTIVE/OVERNIGHT/INTERVAL HX 06/27/14 - still wit fevers - Rx tylenol and cooling blanket C Diff test pending (only 2 small loose stool per RN). CT Cehst with dense LLL consolidation - no empyema.  Failed SBT yesterday. No ch angeoverall  VITAL SIGNS: Temp:  [99.6 F (37.6 C)-103.9 F (39.9 C)] 99.6 F (37.6 C) (12/29 0800) Pulse Rate:  [95-139] 101 (12/29 0806) Resp:  [18-32] 25 (12/29 0806) BP: (80-110)/(44-72) 95/64 mmHg (12/29 0806) SpO2:  [96 %-100 %] 100 % (12/29 0806) FiO2 (%):  [30 %] 30 % (12/29 0806) Weight:  [66.7 kg (147 lb 0.8 oz)] 66.7 kg (147 lb 0.8 oz) (12/29 0414)   VENTILATOR SETTINGS: Vent Mode:  [-] PRVC FiO2 (%):  [30 %] 30 % Set Rate:  [15 bmp] 15 bmp Vt Set:  [450 mL] 450 mL PEEP:  [5 cmH20] 5 cmH20 Pressure Support:  [10 cmH20] 10 cmH20 Plateau Pressure:  [20 cmH20-27 cmH20] 22 cmH20   INTAKE / OUTPUT: Intake/Output      12/28 0701 - 12/29 0700 12/29 0701 - 12/30 0700   I.V. (mL/kg) 1110 (16.6) 60 (0.9)   Other 410    NG/GT 1155 55   IV Piggyback 900    Total Intake(mL/kg) 3575 (53.6) 115 (1.7)   Urine (mL/kg/hr) 1335 (0.8)    Total Output 1335     Net +2240 +115          PHYSICAL EXAMINATION: General: no distress Neuro: will stick out tongue on command, tracks (currently RASS -2_ HEENT: trach site clean Cardiovascular: regular, tachycardic Lungs: even/non-labored, scattered rhonchi Abdomen: soft, non tender, PEG site clean Ext: 1+ edema   LABS: PULMONARY No results for input(s): PHART, PCO2ART, PO2ART, HCO3, TCO2, O2SAT in the last 168 hours.  Invalid input(s): PCO2, PO2  CBC  Recent Labs Lab 06/25/14 0400 06/26/14 0511 06/27/14 0416  HGB 9.4* 10.5* 8.6*  HCT 29.8* 32.9* 27.1*  WBC 14.4* 18.5* 22.1*  PLT 277 317 225    COAGULATION No results for input(s): INR in the last 168 hours.  CARDIAC  No results for input(s): TROPONINI in the last 168 hours. No results for input(s): PROBNP in the last 168 hours.   CHEMISTRY  Recent Labs Lab 06/21/14 0445  06/23/14 0545 06/24/14 0350 06/25/14 0400 06/26/14 0511 06/27/14 0416  NA 132*  < > 135 135 131* 135 136  K 2.5*  < > 3.4* 3.8 3.5 3.3* 2.3*  CL 96  < > 103 103 100 105 110  CO2 28  < > 21 22 22 21  17*  GLUCOSE 130*  < > 124* 129* 121* 142* 170*  BUN 10  < >  9 11 9 12 11   CREATININE <0.30*  < > <0.30* <0.30* <0.30* <0.30* <0.30*  CALCIUM 8.8  < > 9.3 9.4 9.0 9.3 8.4  MG 1.7  --  1.9 1.9  --   --  1.5  PHOS  --   --   --   --   --   --  2.9  < > = values in this interval not displayed. CrCl cannot be calculated (This lab value for creatinine cannot be used to calculate CrCl because it is not a number: <0.30).   LIVER  Recent Labs Lab 06/21/14 0445  AST 34  ALT 66*  ALKPHOS 62  BILITOT 0.6  PROT 5.8*  ALBUMIN 2.4*     INFECTIOUS  Recent Labs Lab 06/26/14 1505 06/27/14 0416  LATICACIDVEN 1.3  --   PROCALCITON 0.13 0.10     ENDOCRINE CBG (last 3)   Recent Labs  06/26/14 1605 06/26/14 2358 06/27/14 0759  GLUCAP 149* 149* 138*         IMAGING x48h Ct Chest Wo  Contrast  06/26/2014   CLINICAL DATA:  Initial encounter for tachycardia. Patient is quadriplegic with recent cervical abscess debridement.  EXAM: CT CHEST WITHOUT CONTRAST  TECHNIQUE: Multidetector CT imaging of the chest was performed following the standard protocol without IV contrast.  COMPARISON:  None.  FINDINGS: Soft tissue / Mediastinum: The tip of a right-sided PICC line is positioned in the right innominate vein.  There is no axillary lymphadenopathy. Borderline enlarged prevascular and AP window lymph nodes are evident without overt mediastinal lymphadenopathy. Pulmonary vascularity is prominent in the hilar regions which hinders assessment for hilar lymphadenopathy. A left pulmonary artery stent is evident. The heart is upper limits of normal for size to mildly enlarged. No pericardial effusion.  Lungs / Pleura: Tracheostomy tube tip is positioned in the trachea just above the carina. Breathing motion obscures fine architectural detail in the lungs. There is interstitial and patchy airspace disease in the left upper lobe with more confluent collapse/ consolidation in the left lower lobe with associated air bronchograms. A tiny left pleural effusion is associated.  Bones: Visualized portions of the liver and spleen are unremarkable.  Upper Abdomen: Bone windows reveal no worrisome lytic or sclerotic osseous lesions.  IMPRESSION: Dense left lower lobe collapse/ consolidation, suggestive of pneumonia. There is patchy less confluent airspace disease in the left upper lobe.  Tiny left pleural effusion.   Electronically Signed   By: Kennith CenterEric  Mansell M.D.   On: 06/26/2014 16:41   Dg Chest Port 1 View  06/27/2014   CLINICAL DATA:  Respiratory failure.  EXAM: PORTABLE CHEST - 1 VIEW  COMPARISON:  CT 06/18/2014 .  Chest x-ray 06/25/2014.  FINDINGS: Tracheostomy tube and PICC PICC line in stable position. Mediastinum and hilar structures normal. Stable cardiomegaly normal pulmonary vascularity. Left pulmonary  artery stent noted. Surgical clips noted in the anterior mediastinum. Persistent left lower lobe consolidation. No pneumothorax. No acute osseus abnormality. Cervical spine fusion .  IMPRESSION: 1. Lines and tubes in stable position. 2. Persistent left lower lobe consolidation.   Electronically Signed   By: Maisie Fushomas  Register   On: 06/27/2014 07:38        ASSESSMENT / PLAN:  PULMONARY INDWELLING DEVICES: Trach (chronic) A: Acute on chronic respiratory failure from PNA. (CT 12/28 - dense LLL Pna with Small L Effusion )  S/p chronic tracheostomy   -06/27/14:  on full vent support, fails SBT  - No change  P:  Full support >> she is not able to tolerate vent weaning F/u CXR intermittently Husband wants to take her home but not sure this is best option as he is wheelchair bound and family disagrees with him  CARDIOVASCULAR RUE PICC (? Insertion date) >>  A:  Sinus tachycardia. Chronic hypotension.   -currently map 71 wit HR 120 P:  Continue lopressor Continue midodrine  RENAL A:   Hypokalemia - repleted by elink Hypomag  P:   Trend BMP Replace electrolytes as indicated  KCL x1 x NS @ 50 ml/hr  GASTROINTESTINAL PEG (chronic) >>  A:   Protein calorie malnutrition s/p PEG tube. P:   Pepcid for SUP Continue tube feeds  HEMATOLOGIC A:   Anemia of chronic disease. Hx UE DVT - not on anticoagulation. P:  F/u CBC intermittently SQ heparin for DVT prevention  INFECTIOUS Resp 12/22 >> Serratia >> R cefazolin, S otherwise Blood 12/22 >> Enterococcus 1/2 >> CT chest 12/28 - dense LLL consolidation   A:   Serratia HCAP. Chronic cervical osteomyelitis and diskitis - on dicloxacillin through 08/14/14. Enterococcal bacteremia.   - 12/28 - ongoing fevers +, sepsis biomarkers not overwhelming  P:   Await C Diff PCR 06/27/14 Cipro 12/21 >>  Vancomycin 12/24 >> Dicloxacillin (long term for MSSA epidural abscess) >> 08/14/14  Cooling blanket for family and  patient palliation of fevers Await cultures  Follow fever curve / leukocytosis  If BC only positive for 1/2 with enterococcus, would not consider TEE.  Await results Appreciate ID input whho signed off 06/23/14  ENDOCRINE A:   Hyperglycemia P:   Monitor blood sugars while on tube feeds  NEUROLOGIC A:   Quadreplegia.  P:   Monitor clinically Supportive care     CODE     Code Status Orders        Start     Ordered   06/19/14 2215  Full code   Continuous     06/19/14 2218       FAMILY Updates 12/28: sister updated. She prefers patient stays at hospital till vent off. She disagrees with husband who wants to take patient home.   06/27/14 - Move to SDU. No family at bedside. Orders for home vent, home hospital bed placed at husband request    Dr. Kalman Shan, M.D., [facility].C.P Pulmonary and Critical Care Medicine Staff Physician [facility] [place] [facility] and Critical Care Pager: [phone], If no answer or between  15:00h - 7:00h: call [phone]  06/27/2014 8:51 AM

## 2014-06-27 NOTE — Progress Notes (Signed)
CRITICAL VALUE ALERT  Critical value received:  2.3 potassium  Date of notification:  06/27/14  Time of notification:  0500  Critical value read back:Yes.    Nurse who received alert:  Toniann FailLauren Cline RN  MD notified (1st page):  Pola CornElink MD   Time of first page: [phone]  MD notified (2nd page):  Time of second page:  Responding MD:  Pola CornElink MD   Time MD responded: [phone]

## 2014-06-27 NOTE — Progress Notes (Signed)
06/27/2014 patient was transfer from 1624m to 2central, she is a quadriplegic, have peg tube, foley. Patient is alert, but not oriented. Patient is vent trach and was transfer to bed. Patient skin is fine, she a foam dressing on sacrum, skin intact, but heels dry. Patient is total care and q2 turn. [facility] Enrica Corliss RN.

## 2014-06-27 NOTE — Progress Notes (Signed)
Order received to place rectal tube; unable to place due to pt has no rectal tone.  Burnard BuntingKatrice Micaiah Remillard, RN

## 2014-06-27 NOTE — Progress Notes (Signed)
06/27/2014 lab called at 0950, stated patient was c-diff positive. Dr Marchelle Gearingamaswamy was made aware. [facility] Sherlon Nied RN.

## 2014-06-27 NOTE — Progress Notes (Signed)
Notified that Pt is  positive for c. Diff; Dr Marchelle Gearingamaswamy notified by Gloriajean DellNadine, RN on 2C;  No family at bedside for education; pt quadraplegic on ventilator and not interactive;  Pt placed on enteric precautions and infection prevention per protocol.  Burnard BuntingKatrice Royal Beirne, RN

## 2014-06-27 NOTE — Progress Notes (Signed)
06/26/2014 influenza vaccine given in left deltoid at 1228. Lot#:[id number] and expire 12/20/2014. [facility] Abrahm Mancia RN.

## 2014-06-27 NOTE — Progress Notes (Signed)
Patient transferred to 2c04 without complications. Vital signs stable at this time. Patient tolerated well. RN and Tech at bedside for transfer. RT will continue to monitor.

## 2014-06-27 NOTE — Progress Notes (Signed)
C Diff PCR positive Ongoing fevers  Plan Start Rx - see order set   Dr. Kalman ShanMurali Sender Rueb, M.D., [facility].C.P Pulmonary and Critical Care Medicine Staff Physician [facility]  Pulmonary and Critical Care Pager: [phone], If no answer or between  15:00h - 7:00h: call [phone]  06/27/2014 9:59 AM

## 2014-06-27 NOTE — Significant Event (Signed)
CRITICAL VALUE ALERT  Critical value received: positive c. diff  Date of notification: 06/27/14  Time of notification: 0945  Critical value read back:  Yes  Nurse who received alert: Josephine IgoNadine Wadelington  MD notified:  Dr Conni Elliotamawamy  Time MD responded: [phone]

## 2014-06-28 DIAGNOSIS — J9600 Acute respiratory failure, unspecified whether with hypoxia or hypercapnia: Secondary | ICD-10-CM

## 2014-06-28 LAB — BASIC METABOLIC PANEL
Anion gap: 11 (ref 5–15)
BUN: 12 mg/dL (ref 6–23)
CHLORIDE: 104 meq/L (ref 96–112)
CO2: 20 mmol/L (ref 19–32)
Calcium: 9.2 mg/dL (ref 8.4–10.5)
GLUCOSE: 128 mg/dL — AB (ref 70–99)
Potassium: 3.6 mmol/L (ref 3.5–5.1)
Sodium: 135 mmol/L (ref 135–145)

## 2014-06-28 LAB — CBC WITH DIFFERENTIAL/PLATELET
Basophils Absolute: 0 10*3/uL (ref 0.0–0.1)
Basophils Relative: 0 % (ref 0–1)
EOS ABS: 0.2 10*3/uL (ref 0.0–0.7)
EOS PCT: 1 % (ref 0–5)
HCT: 28 % — ABNORMAL LOW (ref 36.0–46.0)
HEMOGLOBIN: 9 g/dL — AB (ref 12.0–15.0)
Lymphocytes Relative: 15 % (ref 12–46)
Lymphs Abs: 1.9 10*3/uL (ref 0.7–4.0)
MCH: 29.6 pg (ref 26.0–34.0)
MCHC: 32.1 g/dL (ref 30.0–36.0)
MCV: 92.1 fL (ref 78.0–100.0)
MONOS PCT: 9 % (ref 3–12)
Monocytes Absolute: 1.1 10*3/uL — ABNORMAL HIGH (ref 0.1–1.0)
Neutro Abs: 9.9 10*3/uL — ABNORMAL HIGH (ref 1.7–7.7)
Neutrophils Relative %: 75 % (ref 43–77)
Platelets: 256 10*3/uL (ref 150–400)
RBC: 3.04 MIL/uL — ABNORMAL LOW (ref 3.87–5.11)
RDW: 14.4 % (ref 11.5–15.5)
WBC: 13.1 10*3/uL — ABNORMAL HIGH (ref 4.0–10.5)

## 2014-06-28 LAB — BLOOD GAS, ARTERIAL
ACID-BASE DEFICIT: 3.2 mmol/L — AB (ref 0.0–2.0)
Bicarbonate: 20.8 mEq/L (ref 20.0–24.0)
Drawn by: 246861
FIO2: 0.3 %
O2 Saturation: 99.4 %
PCO2 ART: 33.9 mmHg — AB (ref 35.0–45.0)
PEEP: 5 cmH2O
Patient temperature: 98.6
Pressure support: 10 cmH2O
RATE: 15 resp/min
TCO2: 21.8 mmol/L (ref 0–100)
VT: 450 mL
pH, Arterial: 7.404 (ref 7.350–7.450)
pO2, Arterial: 132 mmHg — ABNORMAL HIGH (ref 80.0–100.0)

## 2014-06-28 LAB — GLUCOSE, CAPILLARY
Glucose-Capillary: 125 mg/dL — ABNORMAL HIGH (ref 70–99)
Glucose-Capillary: 118 mg/dL — ABNORMAL HIGH (ref 70–99)

## 2014-06-28 LAB — PROCALCITONIN: Procalcitonin: 0.1 ng/mL

## 2014-06-28 LAB — PHOSPHORUS: Phosphorus: 4.5 mg/dL (ref 2.3–4.6)

## 2014-06-28 LAB — MAGNESIUM: Magnesium: 1.9 mg/dL (ref 1.5–2.5)

## 2014-06-28 MED ORDER — VANCOMYCIN 50 MG/ML ORAL SOLUTION
500.0000 mg | Freq: Four times a day (QID) | ORAL | Status: DC
  Administered 2014-06-28 – 2014-07-04 (×24): 500 mg via ORAL
  Filled 2014-06-28 (×27): qty 10

## 2014-06-28 NOTE — Progress Notes (Signed)
Changed vent setting to SIMV/PS per Dr. Cydney OkFinestein verbal order to try to transition patient to home vent, advance RT placed patient on LTV SIMV/PS for 10 minutes to see if patient would tolerate home vent and new vent change, patient tolerated very well, I then placed her back on the Servo I on SIMV/ PS patient tolerated well.

## 2014-06-28 NOTE — Progress Notes (Signed)
PULMONARY / CRITICAL CARE MEDICINE   Name: Kathryn Booth MRN: [id number] DOB: [birth year]    ADMISSION DATE:  06/19/2014 CONSULTATION DATE:  06/28/2014  [facility] MD :  [facility]  CHIEF COMPLAINT:  Tachycardia  INITIAL PRESENTATION:  43 y.o. 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  Recently admitted to [facility] (06/07/14).  Has had persistent tachycardia while at [facility].  Brought to [facility] ED 12/21 after husbands request for further evaluation of tachycardia.  In ED, UA suggestive of UTI.  STUDIES/SIGNIFICANT EVENTS: 10/12  Debridement of cervical abscess [facility]) 12/09  Admitted to [facility] 12/21  Admitted to [facility] with hx as above 12/27 : :  No acute events  06/26/14: Husband definitely wants to take patient home. Care manager says things are being set up. Temp now 102.87F -- ongoing fevers 101-103F. Controlled wit tylenol.    SUBJECTIVE/OVERNIGHT/INTERVAL HX cdif noted, abx started Not weaning well  VITAL SIGNS: Temp:  [98.4 F (36.9 C)-99.5 F (37.5 C)] 98.5 F (36.9 C) (12/30 1211) Pulse Rate:  [31-287] 94 (12/30 1211) Resp:  [21-30] 25 (12/30 1211) BP: (89-112)/(58-72) 102/58 mmHg (12/30 1211) SpO2:  [100 %] 100 % (12/30 1211) FiO2 (%):  [30 %] 30 % (12/30 1026) Weight:  [67.949 kg (149 lb 12.8 oz)] 67.949 kg (149 lb 12.8 oz) (12/30 0209)   VENTILATOR SETTINGS: Vent Mode:  [-] SIMV/PC/PS FiO2 (%):  [30 %] 30 % Set Rate:  [15 bmp] 15 bmp Vt Set:  [450 mL] 450 mL PEEP:  [5 cmH20] 5 cmH20 Pressure Support:  [10 cmH20] 10 cmH20 Plateau Pressure:  [11 cmH20-24 cmH20] 19 cmH20   INTAKE / OUTPUT: Intake/Output      12/29 0701 - 12/30 0700 12/30 0701 - 12/31 0700   I.V. (mL/kg) 687.7 (10.1)    Other 120    NG/GT 1265 55   IV Piggyback 600    Total Intake(mL/kg) 2672.7 (39.3) 55 (0.8)   Urine (mL/kg/hr) 1525 (0.9) 250 (0.6)   Total Output 1525 250   Net +1147.7 -195          PHYSICAL EXAMINATION: General: no distress Neuro: will stick out tongue  on command, tracksintermittent HEENT: trach site clean Cardiovascular: s1 s2 regular, tachycardic Lungs: even/no coarse Abdomen: soft, non tender, PEG site clean Ext: 1+ edema   LABS: PULMONARY No results for input(s): PHART, PCO2ART, PO2ART, HCO3, TCO2, O2SAT in the last 168 hours.  Invalid input(s): PCO2, PO2  CBC  Recent Labs Lab 06/26/14 0511 06/27/14 0416 06/28/14 0550  HGB 10.5* 8.6* 9.0*  HCT 32.9* 27.1* 28.0*  WBC 18.5* 22.1* 13.1*  PLT 317 225 256    COAGULATION No results for input(s): INR in the last 168 hours.  CARDIAC  No results for input(s): TROPONINI in the last 168 hours. No results for input(s): PROBNP in the last 168 hours.   CHEMISTRY  Recent Labs Lab 06/23/14 0545 06/24/14 0350 06/25/14 0400 06/26/14 0511 06/27/14 0416 06/28/14 0550  NA 135 135 131* 135 136 135  K 3.4* 3.8 3.5 3.3* 2.3* 3.6  CL 103 103 100 105 110 104  CO2 21 22 22 21  17* 20  GLUCOSE 124* 129* 121* 142* 170* 128*  BUN 9 11 9 12 11 12   CREATININE <0.30* <0.30* <0.30* <0.30* <0.30* <0.30*  CALCIUM 9.3 9.4 9.0 9.3 8.4 9.2  MG 1.9 1.9  --   --  1.5 1.9  PHOS  --   --   --   --  2.9 4.5  CrCl cannot be calculated (This lab value for creatinine cannot be used to calculate CrCl because it is not a number: <0.30).   LIVER No results for input(s): AST, ALT, ALKPHOS, BILITOT, PROT, ALBUMIN, INR in the last 168 hours.   INFECTIOUS  Recent Labs Lab 06/26/14 1505 06/27/14 0416 06/28/14 0550  LATICACIDVEN 1.3  --   --   PROCALCITON 0.13 0.10 0.10     ENDOCRINE CBG (last 3)   Recent Labs  06/27/14 0759 06/27/14 1208 06/28/14 1208  GLUCAP 138* 127* 125*         IMAGING x48h Ct Chest Wo Contrast  06/26/2014   CLINICAL DATA:  Initial encounter for tachycardia. Patient is quadriplegic with recent cervical abscess debridement.  EXAM: CT CHEST WITHOUT CONTRAST  TECHNIQUE: Multidetector CT imaging of the chest was performed following the standard protocol  without IV contrast.  COMPARISON:  None.  FINDINGS: Soft tissue / Mediastinum: The tip of a right-sided PICC line is positioned in the right innominate vein.  There is no axillary lymphadenopathy. Borderline enlarged prevascular and AP window lymph nodes are evident without overt mediastinal lymphadenopathy. Pulmonary vascularity is prominent in the hilar regions which hinders assessment for hilar lymphadenopathy. A left pulmonary artery stent is evident. The heart is upper limits of normal for size to mildly enlarged. No pericardial effusion.  Lungs / Pleura: Tracheostomy tube tip is positioned in the trachea just above the carina. Breathing motion obscures fine architectural detail in the lungs. There is interstitial and patchy airspace disease in the left upper lobe with more confluent collapse/ consolidation in the left lower lobe with associated air bronchograms. A tiny left pleural effusion is associated.  Bones: Visualized portions of the liver and spleen are unremarkable.  Upper Abdomen: Bone windows reveal no worrisome lytic or sclerotic osseous lesions.  IMPRESSION: Dense left lower lobe collapse/ consolidation, suggestive of pneumonia. There is patchy less confluent airspace disease in the left upper lobe.  Tiny left pleural effusion.   Electronically Signed   By: Kennith CenterEric  Mansell M.D.   On: 06/26/2014 16:41   Dg Chest Port 1 View  06/27/2014   CLINICAL DATA:  Respiratory failure.  EXAM: PORTABLE CHEST - 1 VIEW  COMPARISON:  CT 06/18/2014 .  Chest x-ray 06/25/2014.  FINDINGS: Tracheostomy tube and PICC PICC line in stable position. Mediastinum and hilar structures normal. Stable cardiomegaly normal pulmonary vascularity. Left pulmonary artery stent noted. Surgical clips noted in the anterior mediastinum. Persistent left lower lobe consolidation. No pneumothorax. No acute osseus abnormality. Cervical spine fusion .  IMPRESSION: 1. Lines and tubes in stable position. 2. Persistent left lower lobe  consolidation.   Electronically Signed   By: Maisie Fushomas  Register   On: 06/27/2014 07:38      ASSESSMENT / PLAN:  PULMONARY INDWELLING DEVICES: Trach (chronic) A: Acute on chronic respiratory failure from PNA. (CT 12/28 - dense LLL Pna with Small L Effusion )  S/p chronic tracheostomy   -06/27/14:  on full vent support, fails SBT  - No change  P:   Home vent attempt Simv switch with PS 10 on unasst breaths abg should follow pcxr improved overall  CARDIOVASCULAR RUE PICC (? Insertion date) >>  A:  Sinus tachycardia. Chronic hypotension.   -currently map 71 wit HR 120 P:  Continue lopressor Continue midodrine tele  RENAL A:   Hypomag resolved  P:   Trend BMP Allow pos balance,  Repeat chem  GASTROINTESTINAL PEG (chronic) >>  A:   Protein calorie malnutrition  s/p PEG tube. P:   Pepcid for SUP Continue tube feeds  HEMATOLOGIC A:   Anemia of chronic disease. Hx UE DVT - not on anticoagulation. P:  F/u CBC intermittently Limit phlebtoomy SQ heparin for DVT prevention  INFECTIOUS Resp 12/22 >> Serratia >> R cefazolin, S otherwise Blood 12/22 >> Enterococcus 1/2 >> CT chest 12/28 - dense LLL consolidation resolve rapidly cdiff pos 12/29  A:   Serratia HCAP. Chronic cervical osteomyelitis and diskitis - on dicloxacillin through 08/14/14. Enterococcal bacteremia. Cdiff pos  - 12/28 - ongoing fevers +, sepsis biomarkers not overwhelming  P:   C Diff PCR 06/27/14>>>POS Cipro 12/21 >> 5 days then dc by ID Vancomycin 12/24 >> Oral Vanc 12/28>>> Dicloxacillin (long term for MSSA epidural abscess) >> 08/14/14  Short course cipro per ID done enterococcus blood  = per ID no tee Continued vanc for entroccus Would increase oral vanc given other abx and now cdiff  ENDOCRINE A:   Hyperglycemia P:   Monitor blood sugars while on tube feeds  NEUROLOGIC A:   Quadreplegia.  P:   Monitor clinically Supportive care  CODE     Code Status Orders         Start     Ordered   06/19/14 2215  Full code   Continuous     06/19/14 2218       FAMILY Updates 12/28: sister updated. She prefers patient stays at hospital till vent off. She disagrees with husband who wants to take patient home.   Mcarthur Rossettianiel J. Tyson AliasFeinstein, MD, FACP Pgr: [phone] [place] [facility]

## 2014-06-28 NOTE — Progress Notes (Signed)
Nutrition Brief Note  For [facility], after discharge pt can return to tube feeding regimen that she was receiving PTA. TF Regimen PTA as follows: IsoSource 1.5 Cal, 300 ml via PEG, QID (Provides 1800 calories, 82 grams of protein, and 917 ml of water daily).   While admitted and on vent support continue current TF regimen of Vital AF 1.2 @ 55 ml/hr to provide 1584 kcal and 99 grams protein.   Ian Malkineanne Barnett RD, LDN Inpatient Clinical Dietitian Pager: [phone] After Hours Pager: [phone]

## 2014-06-29 DIAGNOSIS — J9610 Chronic respiratory failure, unspecified whether with hypoxia or hypercapnia: Secondary | ICD-10-CM | POA: Insufficient documentation

## 2014-06-29 DIAGNOSIS — E876 Hypokalemia: Secondary | ICD-10-CM | POA: Insufficient documentation

## 2014-06-29 DIAGNOSIS — J9811 Atelectasis: Secondary | ICD-10-CM | POA: Insufficient documentation

## 2014-06-29 DIAGNOSIS — J189 Pneumonia, unspecified organism: Secondary | ICD-10-CM | POA: Insufficient documentation

## 2014-06-29 LAB — CBC WITH DIFFERENTIAL/PLATELET
Basophils Absolute: 0 10*3/uL (ref 0.0–0.1)
Basophils Relative: 0 % (ref 0–1)
EOS PCT: 2 % (ref 0–5)
Eosinophils Absolute: 0.2 10*3/uL (ref 0.0–0.7)
HCT: 28.1 % — ABNORMAL LOW (ref 36.0–46.0)
Hemoglobin: 9 g/dL — ABNORMAL LOW (ref 12.0–15.0)
LYMPHS PCT: 14 % (ref 12–46)
Lymphs Abs: 1.9 10*3/uL (ref 0.7–4.0)
MCH: 30.4 pg (ref 26.0–34.0)
MCHC: 32 g/dL (ref 30.0–36.0)
MCV: 94.9 fL (ref 78.0–100.0)
Monocytes Absolute: 0.9 10*3/uL (ref 0.1–1.0)
Monocytes Relative: 7 % (ref 3–12)
NEUTROS PCT: 77 % (ref 43–77)
Neutro Abs: 9.9 10*3/uL — ABNORMAL HIGH (ref 1.7–7.7)
Platelets: 258 10*3/uL (ref 150–400)
RBC: 2.96 MIL/uL — ABNORMAL LOW (ref 3.87–5.11)
RDW: 14.5 % (ref 11.5–15.5)
WBC: 12.8 10*3/uL — ABNORMAL HIGH (ref 4.0–10.5)

## 2014-06-29 LAB — GLUCOSE, CAPILLARY
GLUCOSE-CAPILLARY: 125 mg/dL — AB (ref 70–99)
GLUCOSE-CAPILLARY: 126 mg/dL — AB (ref 70–99)
Glucose-Capillary: 124 mg/dL — ABNORMAL HIGH (ref 70–99)

## 2014-06-29 LAB — BASIC METABOLIC PANEL
Anion gap: 8 (ref 5–15)
BUN: 10 mg/dL (ref 6–23)
CHLORIDE: 104 meq/L (ref 96–112)
CO2: 25 mmol/L (ref 19–32)
Calcium: 9.4 mg/dL (ref 8.4–10.5)
Creatinine, Ser: <0.3 mg/dL — ABNORMAL LOW (ref 0.50–1.10)
GLUCOSE: 124 mg/dL — AB (ref 70–99)
Potassium: 3.2 mmol/L — ABNORMAL LOW (ref 3.5–5.1)
Sodium: 137 mmol/L (ref 135–145)

## 2014-06-29 LAB — PHOSPHORUS: PHOSPHORUS: 6 mg/dL — AB (ref 2.3–4.6)

## 2014-06-29 LAB — MAGNESIUM: Magnesium: 2 mg/dL (ref 1.5–2.5)

## 2014-06-29 MED ORDER — POTASSIUM CHLORIDE 20 MEQ/15ML (10%) PO SOLN
40.0000 meq | Freq: Once | ORAL | Status: AC
  Administered 2014-06-29: 40 meq
  Filled 2014-06-29: qty 30

## 2014-06-29 NOTE — Progress Notes (Signed)
Ealeir today cooling blanket off to see fever curve  Now   Filed Vitals:   06/29/14 1646 06/29/14 1750 06/29/14 1958 06/29/14 2000  BP: 102/62 116/85 99/49 95/48   Pulse: 100 79  100  Temp:   101.7 F (38.7 C)   TempSrc:   Axillary   Resp: 24   22  Height:      Weight:      SpO2: 100%   100%    Temp 101.42F  A Fever - long standing  P No active intervention unless temp > 102.5 (NEJM 2015 [place] study) or patient/family palliation  Dr. Kalman ShanMurali Kamy Poinsett, M.D., [facility].C.P Pulmonary and Critical Care Medicine Staff Physician [facility] [place] [facility]monary and Critical Care Pager: [phone], If no answer or between  15:00h - 7:00h: call [phone]  06/29/2014 8:40 PM

## 2014-06-29 NOTE — Progress Notes (Deleted)
Orthopedic Tech Progress Note Patient Details:  Prudence DavidsonDana Harris Feb 14, 1971 [id number]  Patient ID: Prudence Davidsonana Harris, female   DOB: Feb 14, 1971, 43 y.o.   MRN: [id number]   Nikki DomCrawford, Henessy Rohrer 06/29/2014, 2:36 PM  Correction : brace order is not for this pt and this note needs to be deleted

## 2014-06-29 NOTE — Progress Notes (Signed)
PULMONARY / CRITICAL CARE MEDICINE   Name: Kathryn DavidsonDana Booth MRN: [id number] DOB: 02-Aug-1970    ADMISSION DATE:  06/19/2014 CONSULTATION DATE:  06/29/2014  [facility] MD :  [facility]  CHIEF COMPLAINT:  Tachycardia  INITIAL PRESENTATION:  43 y.o. 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  Recently admitted to [facility] (06/07/14).  Has had persistent tachycardia while at [facility].  Brought to [facility] ED 12/21 after husbands request for further evaluation of tachycardia.  In ED, UA suggestive of UTI.  STUDIES/SIGNIFICANT EVENTS: 10/12  Debridement of cervical abscess [facility] 12/09  Admitted to [facility] 12/21  [facility]/27: No acute events  06/26/14: Husband definitely wants to take patient home. Care manager says things are being set up. Temp now 102.70F -- ongoing fevers 101-103F. Controlled wit tylenol.    SUBJECTIVE/OVERNIGHT/INTERVAL HX No acute events overnight  VITAL SIGNS: Temp:  [98.2 F (36.8 C)-99 F (37.2 C)] 98.2 F (36.8 C) (12/31 0835) Pulse Rate:  [48-107] 48 (12/31 0835) Resp:  [21-31] 25 (12/31 0835) BP: (93-109)/(56-69) 109/56 mmHg (12/31 0835) SpO2:  [100 %] 100 % (12/31 0835) FiO2 (%):  [30 %] 30 % (12/31 0835) Weight:  [150 lb 4.8 oz (68.176 kg)] 150 lb 4.8 oz (68.176 kg) (12/31 0458)   VENTILATOR SETTINGS: Vent Mode:  [-] SIMV;PSV FiO2 (%):  [30 %] 30 % Set Rate:  [15 bmp] 15 bmp Vt Set:  [450 mL] 450 mL PEEP:  [5 cmH20] 5 cmH20 Pressure Support:  [10 cmH20] 10 cmH20 Plateau Pressure:  [14 cmH20-23 cmH20] 14 cmH20   INTAKE / OUTPUT: Intake/Output      12/30 0701 - 12/31 0700 12/31 0701 - 01/01 0700   I.V. (mL/kg) 230 (3.4)    Other     NG/GT 1155    IV Piggyback 600    Total Intake(mL/kg) 1985 (29.1)    Urine (mL/kg/hr) 1075 (0.7)    Total Output 1075     Net +910            PHYSICAL EXAMINATION: General: no distress Neuro: will stick out tongue on command, tracksintermittent HEENT: trach site  clean Cardiovascular: s1 s2 regular, tachycardic Lungs: even/no coarse Abdomen: soft, non tender, PEG site clean Ext: 1+ edema   LABS: PULMONARY  Recent Labs Lab 06/28/14 1340  PHART 7.404  PCO2ART 33.9*  PO2ART 132.0*  HCO3 20.8  TCO2 21.8  O2SAT 99.4    CBC  Recent Labs Lab 06/27/14 0416 06/28/14 0550 06/29/14 0735  HGB 8.6* 9.0* 9.0*  HCT 27.1* 28.0* 28.1*  WBC 22.1* 13.1* 12.8*  PLT 225 256 258    COAGULATION No results for input(s): INR in the last 168 hours.  CARDIAC  No results for input(s): TROPONINI in the last 168 hours. No results for input(s): PROBNP in the last 168 hours.   CHEMISTRY  Recent Labs Lab 06/23/14 0545 06/24/14 0350 06/25/14 0400 06/26/14 0511 06/27/14 0416 06/28/14 0550 06/29/14 0518 06/29/14 0735  NA 135 135 131* 135 136 135  --  137  K 3.4* 3.8 3.5 3.3* 2.3* 3.6  --  3.2*  CL 103 103 100 105 110 104  --  104  CO2 21 22 22 21  17* 20  --  25  GLUCOSE 124* 129* 121* 142* 170* 128*  --  124*  BUN 9 11 9 12 11 12   --  10  CREATININE <0.30* <0.30* <0.30* <0.30* <0.30* <0.30*  --  <0.30*  CALCIUM 9.3 9.4 9.0  9.3 8.4 9.2  --  9.4  MG 1.9 1.9  --   --  1.5 1.9  --  2.0  PHOS  --   --   --   --  2.9 4.5 REQUEST CREDITED 6.0*   CrCl cannot be calculated (This lab value for creatinine cannot be used to calculate CrCl because it is not a number: <0.30).   LIVER No results for input(s): AST, ALT, ALKPHOS, BILITOT, PROT, ALBUMIN, INR in the last 168 hours.   INFECTIOUS  Recent Labs Lab 06/26/14 1505 06/27/14 0416 06/28/14 0550  LATICACIDVEN 1.3  --   --   PROCALCITON 0.13 0.10 0.10     ENDOCRINE CBG (last 3)   Recent Labs  06/28/14 2023 06/29/14 0106 06/29/14 0736  GLUCAP 118* 124* 125*    IMAGING x48h No results found.   ASSESSMENT / PLAN:  PULMONARY INDWELLING DEVICES: Trach (chronic) A: Acute on chronic respiratory failure from PNA. (CT 12/28 - dense LLL Pna with Small L Effusion )  S/p chronic  tracheostomy  P:   Home vent attempt Simv switch with PS 10 on unasst breaths Attempt ps 20 on sbt pcxr in am with failed weaning attempts   CARDIOVASCULAR RUE PICC (? Insertion date) >>  A:  Sinus tachycardia. Chronic hypotension.   -currently map 71 wit HR 120 P:  Continue lopressor Continue midodrine Tele  RENAL A:   Hypomag resolved Hyopkalmeia  P:   Trend BMP Allow pos balance Repeat chem Replete K 40meq once  GASTROINTESTINAL PEG (chronic) >>  A:   Protein calorie malnutrition s/p PEG tube. P:   Pepcid for SUP Continue tube feeds  HEMATOLOGIC A:   Anemia of chronic disease. Hx UE DVT - not on anticoagulation. P:  Limit phlebtoomy SQ heparin for DVT prevention  INFECTIOUS Resp 12/22 >> Serratia >> R cefazolin, S otherwise Blood 12/22 >> Enterococcus 1/2 >> CT chest 12/28 - dense LLL consolidation resolve rapidly cdiff pos 12/29  A:   Serratia HCAP. Chronic cervical osteomyelitis and diskitis - on dicloxacillin through 08/14/14. Enterococcal bacteremia. Cdiff pos 12/29, temp better after treatment   P:   C Diff PCR 06/27/14>>>POS Cipro 12/21 >> 5 days then dc by ID Vancomycin 12/24 >> Oral Vanc 12/28>>> Dicloxacillin (long term for MSSA epidural abscess) >> 08/14/14  Short course cipro per ID done enterococcus blood  = per ID no tee Continued vanc for entroccus (IV) Oral vanc for Cdiff to 500 mg If continued fevers may need to consider PICC change. No role prebiotic  ENDOCRINE A:   Hyperglycemia P:   Monitor blood sugars while on tube feeds  NEUROLOGIC A:   Quadreplegia.  P:   Monitor clinically Supportive care   FAMILY Updates: sister updated. She prefers patient stays at hospital till vent off. She disagrees with husband who wants to take patient home. Husband wants to take cooling blanket home.He has been informed that if she needs cooling blanket, she likely needs to be in hospital.    Joneen RoachPaul Hoffman, AGACNP-BC [place]  Pulmonology/Critical Care Pager [phone] or [phone]     STAFF NOTE: Cindi CarbonI, Daniel Feinstein, MD FACP have personally reviewed patient's available data, including medical history, events of note, physical examination and test results as part of my evaluation. I have discussed with resident/NP and other care providers such as pharmacist, RN and RRT. In addition, I personally evaluated patient and elicited key findings of: temp control better after cdiff treatment, pcxr in am as failed weaning, attempt  ps 20-22, she cannot go home yet and not with a cooling blanket, see ABX course plans, supp K , no changes in examination  Mcarthur Rossetti. Tyson Alias, MD, FACP Pgr: [phone] [facility] 06/29/2014 12:23 PM

## 2014-06-29 NOTE — Progress Notes (Signed)
ANTIBIOTIC CONSULT NOTE - Follow-Up Consult  Pharmacy Consult:  Vancomycin Indication:  Enterococcus Bacteremia  Allergies  Allergen Reactions  . Tape Rash   Patient Measurements: Height: 5' (152.4 cm) Weight: 150 lb 4.8 oz (68.176 kg) IBW/kg (Calculated) : 45.5  Vital Signs: Temp: 98.2 F (36.8 C) (12/31 0835) Temp Source: Axillary (12/31 0835) BP: 109/56 mmHg (12/31 0835) Pulse Rate: 48 (12/31 0835) Intake/Output from previous day: 12/30 0701 - 12/31 0700 In: 2050 [I.V.:240; NG/GT:1210; IV Piggyback:600] Out: 1075 [Urine:1075] Intake/Output from this shift: Total I/O In: 195 [I.V.:30; NG/GT:165] Out: -   Recent Labs  06/27/14 0416 06/28/14 0550 06/29/14 0735  WBC 22.1* 13.1* 12.8*  HGB 8.6* 9.0* 9.0*  PLT 225 256 258  CREATININE <0.30* <0.30* <0.30*   Microbiology: Recent Results (from the past 720 hour(s))  Urine culture     Status: None   Collection Time: 06/19/14  8:13 PM  Result Value Ref Range Status   Specimen Description URINE, CATHETERIZED  Final   Special Requests NONE  Final   Culture  Setup Time   Final    06/20/2014 04:19 Performed at [facility]    Colony Count   Final    30,000 COLONIES/ML Performed at [facility]    Culture   Final    Multiple bacterial morphotypes present, none predominant. Suggest appropriate recollection if clinically indicated. Performed at [facility]    Report Status 06/21/2014 FINAL  Final  MRSA PCR Screening     Status: None   Collection Time: 06/20/14 12:20 AM  Result Value Ref Range Status   MRSA by PCR NEGATIVE NEGATIVE Final    Comment:        The GeneXpert MRSA Assay (FDA approved for NASAL specimens only), is one component of a comprehensive MRSA colonization surveillance program. It is not intended to diagnose MRSA infection nor to guide or monitor treatment for MRSA infections.   Culture, blood (routine x 2)     Status: None (Preliminary result)   Collection Time:  06/20/14  1:09 AM  Result Value Ref Range Status   Specimen Description BLOOD LEFT HAND  Final   Special Requests BOTTLES DRAWN AEROBIC AND ANAEROBIC 5CC EA  Final   Culture  Setup Time   Final    06/20/2014 04:50 Performed at [facility]    Culture   Final    ENTEROCOCCUS SPECIES Note: Gram Stain Report Called to,Read Back By and Verified With: Tory EmeraldMICHELLE TOLER RN [id number] Performed at [facility]    Report Status PENDING  Incomplete  Culture, blood (routine x 2)     Status: None (Preliminary result)   Collection Time: 06/20/14  1:16 AM  Result Value Ref Range Status   Specimen Description BLOOD RIGHT HAND  Final   Special Requests BOTTLES DRAWN AEROBIC AND ANAEROBIC 5CC EA  Final   Culture  Setup Time   Final    06/20/2014 04:47 Performed at [facility]    Culture   Final           BLOOD CULTURE RECEIVED NO GROWTH TO DATE CULTURE WILL BE HELD FOR 5 DAYS BEFORE ISSUING A FINAL NEGATIVE REPORT Performed at [facility]    Report Status PENDING  Incomplete  Culture, respiratory (NON-Expectorated)     Status: None   Collection Time: 06/20/14  1:18 AM  Result Value Ref Range Status   Specimen Description TRACHEAL ASPIRATE  Final   Special Requests NONE  Final  Gram Stain   Final    MODERATE WBC PRESENT, PREDOMINANTLY PMN RARE SQUAMOUS EPITHELIAL CELLS PRESENT RARE GRAM NEGATIVE RODS Performed at [facility]    Culture   Final    ABUNDANT SERRATIA MARCESCENS Performed at [facility]    Report Status 06/22/2014 FINAL  Final   Organism ID, Bacteria SERRATIA MARCESCENS  Final      Susceptibility   Serratia marcescens - MIC*    CEFAZOLIN >=64 RESISTANT Resistant     CEFEPIME <=1 SENSITIVE Sensitive     CEFTAZIDIME <=1 SENSITIVE Sensitive     CEFTRIAXONE <=1 SENSITIVE Sensitive     CIPROFLOXACIN <=0.25 SENSITIVE Sensitive     GENTAMICIN <=1 SENSITIVE Sensitive     TOBRAMYCIN <=1 SENSITIVE Sensitive     TRIMETH/SULFA <=20  SENSITIVE Sensitive     * ABUNDANT SERRATIA MARCESCENS  Clostridium Difficile by PCR     Status: Abnormal   Collection Time: 06/27/14 12:37 AM  Result Value Ref Range Status   C difficile by pcr POSITIVE (A) NEGATIVE Final    Comment: CRITICAL RESULT CALLED TO, READ BACK BY AND VERIFIED WITH: Durenda AgeN. WADELINGTON RN 9:50 06/27/14 ([email or web address])    Medical History: Past Medical History  Diagnosis Date  . Osteomyelitis   . Epidural abscess   . Respiratory failure   . Metabolic encephalopathy   . Nonischemic cardiomyopathy   . Hypotension   . Osteomyelitis of vertebra of cervical region   . Bipolar 1 disorder   . Anxiety disorder   . Hypoxemia   . Bacteremia   . Hypoxic ischemic encephalopathy (HIE)   . Cutaneous abscess of back excluding buttocks   . Quadriplegia   . Dysphagia, oropharyngeal phase   . Tracheostomy in place    Assessment: 43yo female admitted from [facility] for evaluation of tachycardia.  She has a complicated infectious history and is continued on dicloxacillin from PTA for history of cervical hardware/osteomyelitis. She is currently on Vancomycin 1g IV Q8h and dicloxacillin. Cipro treatment complete for serratia in trach aspirate. Patient remains febrile and WBC trending down 22.1>13.1>12.8.  Cipro 12/22 >>12/27 Cefepime 12/23 >>12/24 Dicloxacillin PTA 12/15 >> (through 08/14/14) Fluc 12/22 >> 12/23 Vanc IV 12/24 >> (07/05/14) Vanc PO 12/29 >> (07/11/14)  Vanc trough was 14.7 on 12/28 on 1g IV Q8h  12/22 TA cx - Serratia Marcescens 12/21 BCx x2 - 1/2 Enterococcus 12/21 UCx - negative 12/28 cdiff - pos  Plan:  Continue Vancomycin 1g IV Q8h Continue Vanc 500mg  PO QID for Cdiff Monitor renal fxn, clinical progress Check Vanc trough on 07/03/14 unless clinical changes require one sooner  Thank you for allowing pharmacy to be a part of this patients care team.  Waynette Butteryegan K. Karlena Luebke, PharmD Clinical Pharmacy Resident Pager: [phone] 06/29/2014 11:07 AM

## 2014-06-29 NOTE — Progress Notes (Signed)
Orthopedic Tech Progress Note Patient Details:  Kathryn DavidsonDana Booth 05-10-1971 [id number]  Patient ID: Kathryn Davidsonana Booth, female   DOB: 05-10-1971, 43 y.o.   MRN: [id number] Called in bio-tech brace order; spoke with Daisy Floroandy  Paytin Ramakrishnan 06/29/2014, 2:35 PM

## 2014-06-30 ENCOUNTER — Inpatient Hospital Stay (HOSPITAL_COMMUNITY): Payer: Medicare Other

## 2014-06-30 DIAGNOSIS — J9611 Chronic respiratory failure with hypoxia: Secondary | ICD-10-CM

## 2014-06-30 LAB — CBC WITH DIFFERENTIAL/PLATELET
Basophils Absolute: 0 10*3/uL (ref 0.0–0.1)
Basophils Relative: 0 % (ref 0–1)
EOS PCT: 1 % (ref 0–5)
Eosinophils Absolute: 0.1 10*3/uL (ref 0.0–0.7)
HCT: 27.6 % — ABNORMAL LOW (ref 36.0–46.0)
Hemoglobin: 8.9 g/dL — ABNORMAL LOW (ref 12.0–15.0)
LYMPHS ABS: 2 10*3/uL (ref 0.7–4.0)
LYMPHS PCT: 19 % (ref 12–46)
MCH: 30.9 pg (ref 26.0–34.0)
MCHC: 32.2 g/dL (ref 30.0–36.0)
MCV: 95.8 fL (ref 78.0–100.0)
MONO ABS: 0.8 10*3/uL (ref 0.1–1.0)
Monocytes Relative: 7 % (ref 3–12)
NEUTROS PCT: 73 % (ref 43–77)
Neutro Abs: 7.8 10*3/uL — ABNORMAL HIGH (ref 1.7–7.7)
Platelets: 280 10*3/uL (ref 150–400)
RBC: 2.88 MIL/uL — ABNORMAL LOW (ref 3.87–5.11)
RDW: 14.4 % (ref 11.5–15.5)
WBC: 10.7 10*3/uL — AB (ref 4.0–10.5)

## 2014-06-30 LAB — BASIC METABOLIC PANEL
Anion gap: 9 (ref 5–15)
BUN: 9 mg/dL (ref 6–23)
CALCIUM: 9.5 mg/dL (ref 8.4–10.5)
CHLORIDE: 102 meq/L (ref 96–112)
CO2: 25 mmol/L (ref 19–32)
Creatinine, Ser: <0.3 mg/dL — ABNORMAL LOW (ref 0.50–1.10)
Glucose, Bld: 108 mg/dL — ABNORMAL HIGH (ref 70–99)
Potassium: 3.8 mmol/L (ref 3.5–5.1)
SODIUM: 136 mmol/L (ref 135–145)

## 2014-06-30 LAB — GLUCOSE, CAPILLARY
Glucose-Capillary: 103 mg/dL — ABNORMAL HIGH (ref 70–99)
Glucose-Capillary: 116 mg/dL — ABNORMAL HIGH (ref 70–99)
Glucose-Capillary: 124 mg/dL — ABNORMAL HIGH (ref 70–99)

## 2014-06-30 LAB — MAGNESIUM: MAGNESIUM: 2.1 mg/dL (ref 1.5–2.5)

## 2014-06-30 LAB — PHOSPHORUS: PHOSPHORUS: 5.7 mg/dL — AB (ref 2.3–4.6)

## 2014-06-30 NOTE — Progress Notes (Signed)
PULMONARY / CRITICAL CARE MEDICINE   Name: Kathryn Booth MRN: [id number] DOB: 1971-05-20    ADMISSION DATE:  06/19/2014 CONSULTATION DATE:  06/30/2014  [facility] MD :  [facility]  CHIEF COMPLAINT:  Tachycardia  INITIAL PRESENTATION:  44 y.o. 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  Recently admitted to [facility] (06/07/14).  Has had persistent tachycardia while at [facility].  Brought to [facility] ED 12/21 after husbands request for further evaluation of tachycardia.  In ED, UA suggestive of UTI.  STUDIES/SIGNIFICANT EVENTS: 10/12  Debridement of cervical abscess [facility]) 12/09  Admitted to [facility] 12/21  Admitted to [facility] with hx as above 12/27: No acute events  06/26/14: Husband definitely wants to take patient home. Care manager says things are being set up. Temp now 102.57F -- ongoing fevers 101-103F. Controlled wit tylenol.    SUBJECTIVE/OVERNIGHT/INTERVAL HX No change overnight.  No fever overnight or this am  VITAL SIGNS: Temp:  [99.7 F (37.6 C)-101.7 F (38.7 C)] 99.9 F (37.7 C) (01/01 0822) Pulse Rate:  [79-128] 103 (01/01 1044) Resp:  [22-32] 32 (01/01 0822) BP: (94-120)/(41-85) 120/69 mmHg (01/01 1044) SpO2:  [100 %] 100 % (01/01 0822) FiO2 (%):  [30 %] 30 % (01/01 1136) Weight:  [67.949 kg (149 lb 12.8 oz)] 67.949 kg (149 lb 12.8 oz) (01/01 0500)   VENTILATOR SETTINGS: Vent Mode:  [-] PSV;CPAP FiO2 (%):  [30 %] 30 % Set Rate:  [15 bmp] 15 bmp Vt Set:  [450 mL] 450 mL PEEP:  [5 cmH20] 5 cmH20 Pressure Support:  [10 cmH20-20 cmH20] 14 cmH20 Plateau Pressure:  [13 cmH20-22 cmH20] 22 cmH20   INTAKE / OUTPUT: Intake/Output      12/31 0701 - 01/01 0700 01/01 0701 - 01/02 0700   I.V. (mL/kg) 220 (3.2)    NG/GT 1210    IV Piggyback 600    Total Intake(mL/kg) 2030 (29.9)    Urine (mL/kg/hr) 2950 (1.8)    Total Output 2950     Net -920            PHYSICAL EXAMINATION: General: wd female in nad Neuro: doesn't follow commands currently, quad HEENT:  trach site clean, nose without purulence or d/c Cardiovascular: minimal tachy with 3/6 sem Lungs: rhonchi noted, no wheezing Abdomen: soft, non tender, PEG site clean Ext: mild edema, no cyanosis   LABS:   CBC  Recent Labs Lab 06/28/14 0550 06/29/14 0735 06/30/14 0500  HGB 9.0* 9.0* 8.9*  HCT 28.0* 28.1* 27.6*  WBC 13.1* 12.8* 10.7*  PLT 256 258 280    COAGULATION No results for input(s): INR in the last 168 hours.  CARDIAC  No results for input(s): TROPONINI in the last 168 hours. No results for input(s): PROBNP in the last 168 hours.   CHEMISTRY  Recent Labs Lab 06/24/14 0350  06/26/14 0511 06/27/14 0416 06/28/14 0550 06/29/14 0518 06/29/14 0735 06/30/14 0500  NA 135  < > 135 136 135  --  137 136  K 3.8  < > 3.3* 2.3* 3.6  --  3.2* 3.8  CL 103  < > 105 110 104  --  104 102  CO2 22  < > 21 17* 20  --  25 25  GLUCOSE 129*  < > 142* 170* 128*  --  124* 108*  BUN 11  < > --  10 9  CREATININE <0.30*  < > <0.30* <0.30* <0.30*  --  <0.30* <0.30*  CALCIUM 9.4  < >  9.3 8.4 9.2  --  9.4 9.5  MG 1.9  --   --  1.5 1.9  --  2.0 2.1  PHOS  --   --   --  2.9 4.5 REQUEST CREDITED 6.0* 5.7*  < > = values in this interval not displayed. CrCl cannot be calculated (This lab value for creatinine cannot be used to calculate CrCl because it is not a number: <0.30).     IMAGING x48h Dg Chest Port 1 View  06/30/2014   CLINICAL DATA:  Check tracheostomy tube position  EXAM: PORTABLE CHEST - 1 VIEW  COMPARISON:  06/27/2014  FINDINGS: Right PICC line and tracheostomy tube are unchanged. Bilateral lower lobe airspace opacities are noted, stable on the left and increasing on the right. Cardiomegaly. Possible small left pleural effusion. No definite right effusion. No acute bony abnormality.  IMPRESSION: Continued dense consolidation in the left lower lobe. Increasing airspace opacity in the right lung base.   Electronically Signed   By: Charlett Nose M.D.   On: 06/30/2014  07:42     ASSESSMENT / PLAN:  PULMONARY INDWELLING DEVICES: Trach (chronic) A: Acute on chronic respiratory failure from PNA. (CT 12/28 - dense LLL Pna with Small L Effusion )  S/p chronic tracheostomy  P:   Home vent attempt?? Continue PS trials as tolerated.  Has adequate Tv today CXR today with increased density right base that I suspect is atx, but need to watch given temp increase yesterday.  CARDIOVASCULAR RUE PICC (? Insertion date) >>  A:  Sinus tachycardia. Chronic hypotension.   -currently map 71 wit HR 120 P:  Continue lopressor Continue midodrine   INFECTIOUS Resp 12/22 >> Serratia >> R cefazolin, S otherwise Blood 12/22 >> Enterococcus 1/2 >> CT chest 12/28 - dense LLL consolidation resolve rapidly cdiff pos 12/29  A:   Serratia HCAP. Chronic cervical osteomyelitis and diskitis - on dicloxacillin through 08/14/14. Enterococcal bacteremia. Cdiff pos 12/29, temp better after treatment   P:   C Diff PCR 06/27/14>>>POS Cipro 12/21 >> 5 days then dc by ID Vancomycin 12/24 >> Oral Vanc 12/28>>> Dicloxacillin (long term for MSSA epidural abscess) >> 08/14/14  Short course cipro per ID done enterococcus blood  = per ID no tee Continued vanc for entroccus (IV) Oral vanc for Cdiff to 500 mg If continued fevers may need to consider PICC change.

## 2014-06-30 NOTE — Progress Notes (Signed)
Pt BP 94/41 and HR 128. MD notified and held scheduled dose of lopressor . Will continue to monitor BP and HR closely.    Alba Destine, RN, BSN

## 2014-06-30 NOTE — Progress Notes (Signed)
Pt rr rate up to 38 - Was on PS 10  from 8 am to 3 pm. Placed back on regular settings.

## 2014-07-01 LAB — GLUCOSE, CAPILLARY
GLUCOSE-CAPILLARY: 137 mg/dL — AB (ref 70–99)
Glucose-Capillary: 122 mg/dL — ABNORMAL HIGH (ref 70–99)
Glucose-Capillary: 85 mg/dL (ref 70–99)

## 2014-07-01 NOTE — Progress Notes (Signed)
PULMONARY / CRITICAL CARE MEDICINE   Name: Kathryn Booth MRN: [id number] DOB: [birth year]    ADMISSION DATE:  06/19/2014 CONSULTATION DATE:  07/01/2014  [facility]ERRING MD :  [facility]  CHIEF COMPLAINT:  Tachycardia  INITIAL PRESENTATION:  44 y.o. 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  Recently admitted to [facility] (06/07/14).  Has had persistent tachycardia while at [facility].  Brought to [facility] ED 12/21 after husbands request for further evaluation of tachycardia.  In ED, UA suggestive of UTI.  STUDIES/SIGNIFICANT EVENTS: 10/12  Debridement of cervical abscess [facility]) 12/09  Admitted to [facility] 12/21  Admitted to [facility] with hx as above 12/27: No acute events  06/26/14: Husband definitely wants to take patient home. Care manager says things are being set up. Temp now 102.35F -- ongoing fevers 101-103F. Controlled wit tylenol.    SUBJECTIVE/OVERNIGHT/INTERVAL HX No events overnight.  Not doing a lot of weaning.  VITAL SIGNS: Temp:  [98.6 F (37 C)-99.5 F (37.5 C)] 99.1 F (37.3 C) (01/02 0832) Pulse Rate:  [90-119] 102 (01/02 1100) Resp:  [20-34] 24 (01/02 1100) BP: (84-110)/(42-64) 110/59 mmHg (01/02 1100) SpO2:  [100 %] 100 % (01/02 1100) FiO2 (%):  [30 %] 30 % (01/02 0813) Weight:  [68.947 kg (152 lb)] 68.947 kg (152 lb) (01/02 0325)   VENTILATOR SETTINGS: Vent Mode:  [-] SIMV/PC/PS FiO2 (%):  [30 %] 30 % Set Rate:  [15 bmp] 15 bmp Vt Set:  [450 mL] 450 mL PEEP:  [5 cmH20] 5 cmH20 Pressure Support:  [10 cmH20-14 cmH20] 10 cmH20 Plateau Pressure:  [13 cmH20-24 cmH20] 24 cmH20   INTAKE / OUTPUT: Intake/Output      01/01 0701 - 01/02 0700 01/02 0701 - 01/03 0700   I.V. (mL/kg) 110 (1.6)    Other  60   NG/GT 750    IV Piggyback 200    Total Intake(mL/kg) 1060 (15.4) 60 (0.9)   Urine (mL/kg/hr) 2000 (1.2)    Total Output 2000     Net -940 +60          PHYSICAL EXAMINATION: General: wd female in nad Neuro: doesn't follow commands currently, quad HEENT:  trach site clean, nose without purulence or d/c Cardiovascular: minimal tachy with 3/6 sem Lungs: rhonchi noted, no wheezing, a few scattered crackles. Abdomen: soft, non tender, PEG site clean Ext: mild edema, no cyanosis      ASSESSMENT / PLAN:  PULMONARY INDWELLING DEVICES: Trach (chronic) A: Acute on chronic respiratory failure from PNA. (CT 12/28 - dense LLL Pna with Small L Effusion )  S/p chronic tracheostomy  P:   Home vent attempt??  I think this is very unlikely.  Continue PS trials as tolerated.   Check cxr in am with increased basilar density yest >>probably atx.  CARDIOVASCULAR RUE PICC (? Insertion date) >>  A:  Sinus tachycardia. Chronic hypotension.   -currently map 71 wit HR 120 P:  Continue lopressor Continue midodrine   INFECTIOUS Resp 12/22 >> Serratia >> R cefazolin, S otherwise Blood 12/22 >> Enterococcus 1/2 >> CT chest 12/28 - dense LLL consolidation resolve rapidly cdiff pos 12/29  A:   Serratia HCAP. Chronic cervical osteomyelitis and diskitis - on dicloxacillin through 08/14/14. Enterococcal bacteremia. Cdiff pos 12/29, temp better after treatment   P:   C Diff PCR 06/27/14>>>POS Cipro 12/21 >> 5 days then dc by ID Vancomycin 12/24 >> Oral Vanc 12/28>>> Dicloxacillin (long term for MSSA epidural abscess) >> 08/14/14  Short course cipro per ID done  enterococcus blood  = per ID no tee Continued vanc for entroccus (IV) Oral vanc for Cdiff to 500 mg If continued fevers may need to consider PICC change.

## 2014-07-01 NOTE — Progress Notes (Signed)
Husband was present tonight stating that he was asked to come and learn to care for his wife doing regular ADLS for her. Time was spent educating husband on turning, cleaning and oral care on patient. Pt was very willing to learn but does not seem capable to do things alone d/t other health issues.

## 2014-07-01 NOTE — Progress Notes (Signed)
Pt.'s husband insisted that pt. Be placed on home vent. Pt. Was placed on home LTV vent with this RT & pt.'s RN at the bedside. Pt. Is tolerating home vent well at this time without any complications. Settings are as follows; Vt: 450, RR: 15, pressure support: 10, I time: 1.0 with 3L O2 bled in.

## 2014-07-02 LAB — GLUCOSE, CAPILLARY
GLUCOSE-CAPILLARY: 106 mg/dL — AB (ref 70–99)
GLUCOSE-CAPILLARY: 121 mg/dL — AB (ref 70–99)
GLUCOSE-CAPILLARY: 123 mg/dL — AB (ref 70–99)
Glucose-Capillary: 118 mg/dL — ABNORMAL HIGH (ref 70–99)

## 2014-07-02 MED ORDER — SODIUM CHLORIDE 0.9 % IJ SOLN
10.0000 mL | INTRAMUSCULAR | Status: DC | PRN
  Administered 2014-07-04 (×2): 10 mL
  Filled 2014-07-02 (×2): qty 40

## 2014-07-02 MED ORDER — SODIUM CHLORIDE 0.9 % IJ SOLN
10.0000 mL | Freq: Two times a day (BID) | INTRAMUSCULAR | Status: DC
  Administered 2014-07-02: 30 mL
  Administered 2014-07-03 (×2): 10 mL
  Administered 2014-07-04: 30 mL
  Administered 2014-07-04 – 2014-07-05 (×3): 10 mL
  Administered 2014-07-06: 30 mL
  Administered 2014-07-06 – 2014-07-08 (×4): 10 mL
  Administered 2014-07-08: 30 mL
  Administered 2014-07-09 – 2014-07-10 (×4): 10 mL
  Administered 2014-07-11: 20 mL
  Administered 2014-07-11 – 2014-07-24 (×25): 10 mL
  Administered 2014-07-25: 20 mL
  Administered 2014-07-25 – 2014-07-26 (×3): 10 mL

## 2014-07-02 MED ORDER — CIPROFLOXACIN IN D5W 400 MG/200ML IV SOLN
400.0000 mg | Freq: Two times a day (BID) | INTRAVENOUS | Status: DC
  Administered 2014-07-02 – 2014-07-06 (×8): 400 mg via INTRAVENOUS
  Filled 2014-07-02 (×9): qty 200

## 2014-07-02 NOTE — Progress Notes (Signed)
Peripherally Inserted Central Catheter/Midline Placement  The IV Nurse has discussed with the patient and/or persons authorized to consent for the patient, the purpose of this procedure and the potential benefits and risks involved with this procedure.  The benefits include less needle sticks, lab draws from the catheter and patient may be discharged home with the catheter.  Risks include, but not limited to, infection, bleeding, blood clot (thrombus formation), and puncture of an artery; nerve damage and irregular heat beat.  Alternatives to this procedure were also discussed.  Consent obtain from husband over the phone  PICC/Midline Placement Documentation  PICC Triple Lumen  PICC Right (Active)  Indication for Insertion or Continuance of Line Home intravenous therapies (PICC only) 07/02/2014 12:00 PM  Site Assessment Clean;Dry;Intact 07/02/2014 12:00 PM  Lumen #1 Status Flushed;Saline locked 07/02/2014 12:00 PM  Lumen #2 Status Flushed;Saline locked 07/02/2014 12:00 PM  Lumen #3 Status Infusing;Flushed 07/02/2014 12:00 PM  Dressing Type Occlusive 07/02/2014 12:00 PM  Dressing Status Clean;Dry;Intact;Antimicrobial disc in place 07/02/2014 12:00 PM  Line Care Cap(s) changed 06/22/2014  1:53 AM  Dressing Intervention Dressing changed;Antimicrobial disc changed 06/26/2014  6:00 PM  Dressing Change Due 07/03/14 07/02/2014 12:00 PM     PICC Triple Lumen 07/02/14 PICC Left Brachial 44 cm 4 cm (Active)  Indication for Insertion or Continuance of Line Vasoactive infusions;Limited venous access - need for IV therapy >5 days (PICC only);Head or chest injuries (Tracheotomy, burns, open chest wounds);Administration of hyperosmolar/irritating solutions (i.e. TPN, Vancomycin, etc.) 07/02/2014  5:05 PM  Exposed Catheter (cm) 4 cm 07/02/2014  5:05 PM  Site Assessment Clean;Dry;Intact 07/02/2014  5:05 PM  Lumen #1 Status Flushed;Saline locked;Blood return noted 07/02/2014  5:05 PM  Lumen #2 Status Flushed;Saline locked;Blood  return noted 07/02/2014  5:05 PM  Lumen #3 Status Flushed;Saline locked;Blood return noted 07/02/2014  5:05 PM  Dressing Type Transparent 07/02/2014  5:05 PM  Dressing Status Clean;Dry;Intact;Antimicrobial disc in place 07/02/2014  5:05 PM  Line Care Connections checked and tightened 07/02/2014  5:05 PM  Dressing Intervention New dressing 07/02/2014  5:05 PM  Dressing Change Due 07/09/14 07/02/2014  5:05 PM       Kathryn Booth 07/02/2014, 5:18 PM

## 2014-07-02 NOTE — Progress Notes (Signed)
PULMONARY / CRITICAL CARE MEDICINE   Name: Kathryn Booth MRN: [id number] DOB: 1971-04-19    ADMISSION DATE:  06/19/2014 CONSULTATION DATE:  07/02/2014  REFERRING MD :  [facility]  CHIEF COMPLAINT:  Tachycardia  INITIAL PRESENTATION:  44 y.o. 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  Recently admitted to [facility] (06/07/14).  Has had persistent tachycardia while at [facility].  Brought to [facility] ED 12/21 after husbands request for further evaluation of tachycardia.  In ED, UA suggestive of UTI.  STUDIES/SIGNIFICANT EVENTS: 10/12  Debridement of cervical abscess [facility]) 12/09  Admitted to [facility] 12/21  Admitted to [facility] with hx as above 12/27: No acute events  06/26/14: Husband definitely wants to take patient home. Care manager says things are being set up. Temp now 102.41F -- ongoing fevers 101-103F. Controlled wit tylenol.    SUBJECTIVE/OVERNIGHT/INTERVAL HX No change overnight except developed low grade temps.  She is now on home vent per family member, who still wishes to get her home.  VITAL SIGNS: Temp:  [99.4 F (37.4 C)-100.7 F (38.2 C)] 100.6 F (38.1 C) (01/03 0913) Pulse Rate:  [87-101] 95 (01/03 1026) Resp:  [15-30] 17 (01/03 0913) BP: (88-111)/(41-63) 104/61 mmHg (01/03 1026) SpO2:  [100 %] 100 % (01/03 0913) FiO2 (%):  [30 %] 30 % (01/02 1922) Weight:  [67 kg (147 lb 11.3 oz)] 67 kg (147 lb 11.3 oz) (01/03 0435)   VENTILATOR SETTINGS: Vent Mode:  [-] SIMV FiO2 (%):  [30 %] 30 % Set Rate:  [15 bmp] 15 bmp Vt Set:  [450 mL] 450 mL PEEP:  [5 cmH20] 5 cmH20 Pressure Support:  [10 cmH20] 10 cmH20 Plateau Pressure:  [22 cmH20] 22 cmH20   INTAKE / OUTPUT: Intake/Output      01/02 0701 - 01/03 0700 01/03 0701 - 01/04 0700   I.V. (mL/kg) 240 (3.6)    Other 60    NG/GT 1996.9    IV Piggyback 600    Total Intake(mL/kg) 2896.9 (43.2)    Urine (mL/kg/hr) 1950 (1.2) 700 (2)   Total Output 1950 700   Net +946.9 -700        Stool Occurrence  1 x      PHYSICAL EXAMINATION: General: wd female in nad Neuro: blinks eyes to questions, alert, quad HEENT: trach site clean, nose without purulence or d/c Cardiovascular: minimal tachy with 3/6 sem Lungs: rhonchi noted, no wheezing, a few scattered crackles. Abdomen: soft, non tender, PEG site clean Ext: mild edema, no cyanosis      ASSESSMENT / PLAN:  PULMONARY INDWELLING DEVICES: Trach (chronic) A: Acute on chronic respiratory failure from PNA. (CT 12/28 - dense LLL Pna with Small L Effusion )  S/p chronic tracheostomy  P:   Continue PS trials as tolerated.  I think it is very unlikely pt can go home with vent next week.    CARDIOVASCULAR RUE PICC (? Insertion date) >>  A:  Sinus tachycardia. Chronic hypotension.   -currently map 71 wit HR 120 P:  Continue lopressor Continue midodrine   INFECTIOUS Resp 12/22 >> Serratia >> R cefazolin, S otherwise Blood 12/22 >> Enterococcus 1/2 >> CT chest 12/28 - dense LLL consolidation resolve rapidly cdiff pos 12/29  A:   Serratia HCAP. Chronic cervical osteomyelitis and diskitis - on dicloxacillin through 08/14/14. Enterococcal bacteremia. Cdiff pos 12/29, temp better after treatment Patient developed low grade fever overnight despite IV vanc.  Will reculture, change picc, and restart gram negative coverage for serratia since she  has had this before.   P:   C Diff PCR 06/27/14>>>POS Cipro 12/21 >> 5 days then dc by ID Vancomycin 12/24 >> Oral Vanc 12/28>>> Dicloxacillin (long term for MSSA epidural abscess) >> 08/14/14  Short course cipro per ID done enterococcus blood  = per ID no tee Continued vanc for entroccus (IV) Oral vanc for Cdiff to 500 mg  -change picc and reculture -add back cipro -recheck cxr in am.

## 2014-07-02 NOTE — Progress Notes (Signed)
Attempt to reach husband for PICC consent.  No answer.  Will try again later.

## 2014-07-02 NOTE — Progress Notes (Signed)
Pt's husband was present tonight insisting that pt be placed on home vent and he perform all ADLS throughout the night himself. Time was spent educating the husband in performing mouth care, suctioning, and turning patient every two hours. The husband has been reluctant at times with performing these tasks. Due to the husband's medical condition, he seems to struggle and may not be capable to do them alone. Will report to AM RN to contact social serves.    Alba Destine, RN, BSN

## 2014-07-02 NOTE — Progress Notes (Signed)
ANTIBIOTIC CONSULT NOTE - Follow-Up Consult  Pharmacy Consult:  Vancomycin Indication:  Enterococcus Bacteremia  Allergies  Allergen Reactions  . Tape Rash   Patient Measurements: Height: 5' (152.4 cm) Weight: 147 lb 11.3 oz (67 kg) IBW/kg (Calculated) : 45.5  Vital Signs: Temp: 100.6 F (38.1 C) (01/03 0913) Temp Source: Axillary (01/03 0913) BP: 94/55 mmHg (01/03 1244) Pulse Rate: 94 (01/03 1244) Intake/Output from previous day: 01/02 0701 - 01/03 0700 In: 2896.9 [I.V.:240; ZO/XW:9604.5; IV Piggyback:600] Out: 1950 [Urine:1950] Intake/Output from this shift: Total I/O In: -  Out: 700 [Urine:700]  Recent Labs  06/30/14 0500  WBC 10.7*  HGB 8.9*  PLT 280  CREATININE <0.30*   Microbiology: Recent Results (from the past 720 hour(s))  Urine culture     Status: None   Collection Time: 06/19/14  8:13 PM  Result Value Ref Range Status   Specimen Description URINE, CATHETERIZED  Final   Special Requests NONE  Final   Culture  Setup Time   Final    06/20/2014 04:19 Performed at [facility] Count   Final    30,000 COLONIES/ML Performed at [facility]    Culture   Final    Multiple bacterial morphotypes present, none predominant. Suggest appropriate recollection if clinically indicated. Performed at [facility]    Report Status 06/21/2014 FINAL  Final  MRSA PCR Screening     Status: None   Collection Time: 06/20/14 12:20 AM  Result Value Ref Range Status   MRSA by PCR NEGATIVE NEGATIVE Final    Comment:        The GeneXpert MRSA Assay (FDA approved for NASAL specimens only), is one component of a comprehensive MRSA colonization surveillance program. It is not intended to diagnose MRSA infection nor to guide or monitor treatment for MRSA infections.   Culture, blood (routine x 2)     Status: None (Preliminary result)   Collection Time: 06/20/14  1:09 AM  Result Value Ref Range Status   Specimen Description BLOOD LEFT  HAND  Final   Special Requests BOTTLES DRAWN AEROBIC AND ANAEROBIC 5CC EA  Final   Culture  Setup Time   Final    06/20/2014 04:50 Performed at [facility]    Culture   Final    ENTEROCOCCUS SPECIES Note: Gram Stain Report Called to,Read Back By and Verified With: Tory Emerald RN [id number] Performed at [facility]    Report Status PENDING  Incomplete  Culture, blood (routine x 2)     Status: None (Preliminary result)   Collection Time: 06/20/14  1:16 AM  Result Value Ref Range Status   Specimen Description BLOOD RIGHT HAND  Final   Special Requests BOTTLES DRAWN AEROBIC AND ANAEROBIC 5CC EA  Final   Culture  Setup Time   Final    06/20/2014 04:47 Performed at [facility]    Culture   Final           BLOOD CULTURE RECEIVED NO GROWTH TO DATE CULTURE WILL BE HELD FOR 5 DAYS BEFORE ISSUING A FINAL NEGATIVE REPORT Performed at [facility]    Report Status PENDING  Incomplete  Culture, respiratory (NON-Expectorated)     Status: None   Collection Time: 06/20/14  1:18 AM  Result Value Ref Range Status   Specimen Description TRACHEAL ASPIRATE  Final   Special Requests NONE  Final   Gram Stain   Final    MODERATE WBC PRESENT, PREDOMINANTLY PMN  RARE SQUAMOUS EPITHELIAL CELLS PRESENT RARE GRAM NEGATIVE RODS Performed at [facility]    Culture   Final    ABUNDANT SERRATIA MARCESCENS Performed at [facility]    Report Status 06/22/2014 FINAL  Final   Organism ID, Bacteria SERRATIA MARCESCENS  Final      Susceptibility   Serratia marcescens - MIC*    CEFAZOLIN >=64 RESISTANT Resistant     CEFEPIME <=1 SENSITIVE Sensitive     CEFTAZIDIME <=1 SENSITIVE Sensitive     CEFTRIAXONE <=1 SENSITIVE Sensitive     CIPROFLOXACIN <=0.25 SENSITIVE Sensitive     GENTAMICIN <=1 SENSITIVE Sensitive     TOBRAMYCIN <=1 SENSITIVE Sensitive     TRIMETH/SULFA <=20 SENSITIVE Sensitive     * ABUNDANT SERRATIA MARCESCENS  Clostridium Difficile by PCR      Status: Abnormal   Collection Time: 06/27/14 12:37 AM  Result Value Ref Range Status   C difficile by pcr POSITIVE (A) NEGATIVE Final    Comment: CRITICAL RESULT CALLED TO, READ BACK BY AND VERIFIED WITH: Durenda Age RN 9:50 06/27/14 ([email or web address])    Medical History: Past Medical History  Diagnosis Date  . Osteomyelitis   . Epidural abscess   . Respiratory failure   . Metabolic encephalopathy   . Nonischemic cardiomyopathy   . Hypotension   . Osteomyelitis of vertebra of cervical region   . Bipolar 1 disorder   . Anxiety disorder   . Hypoxemia   . Bacteremia   . Hypoxic ischemic encephalopathy (HIE)   . Cutaneous abscess of back excluding buttocks   . Quadriplegia   . Dysphagia, oropharyngeal phase   . Tracheostomy in place    Assessment: 44yo female admitted from [facility] for evaluation of tachycardia.  She has a complicated infectious history and is continued on dicloxacillin from PTA for history of cervical hardware/osteomyelitis. She is currently on Vancomycin 1g IV Q8h and dicloxacillin. Cipro treatment complete for serratia in trach aspirate. Patient remains febrile and WBC trending down 22.1>10.7.  Cipro 12/22 >>12/27 Cefepime 12/23 >>12/24 Dicloxacillin PTA 12/15 >> (through 08/14/14) Fluc 12/22 >> 12/23 Vanc IV 12/24 >> (07/05/14) Vanc PO 12/29 >> (07/11/14)  Vanc trough was 14.7 on 12/28 on 1g IV Q8h  12/22 TA cx - Serratia Marcescens 12/21 BCx x2 - 1/2 Enterococcus 12/21 UCx - negative 12/28 cdiff - pos  Plan:  Continue Vancomycin 1g IV Q8h Continue Vanc  PO QID for Cdiff Monitor renal fxn, clinical progress Check Vanc trough on 07/03/14 unless clinical changes require one sooner  Thank you for allowing pharmacy to be a part of this patients care team.  Tad Moore, BCPS  Clinical Pharmacist Pager [phone]  07/02/2014 2:19 PM

## 2014-07-03 ENCOUNTER — Inpatient Hospital Stay (HOSPITAL_COMMUNITY): Payer: Medicare Other

## 2014-07-03 DIAGNOSIS — J9620 Acute and chronic respiratory failure, unspecified whether with hypoxia or hypercapnia: Secondary | ICD-10-CM

## 2014-07-03 LAB — BLOOD GAS, ARTERIAL
ACID-BASE EXCESS: 2.6 mmol/L — AB (ref 0.0–2.0)
Bicarbonate: 26.4 mEq/L — ABNORMAL HIGH (ref 20.0–24.0)
DRAWN BY: 22563
FIO2: 0.21 %
LHR: 15 {breaths}/min
O2 SAT: 95.4 %
PATIENT TEMPERATURE: 98.6
PCO2 ART: 39 mmHg (ref 35.0–45.0)
PEEP: 5 cmH2O
TCO2: 27.6 mmol/L (ref 0–100)
VT: 450 mL
pH, Arterial: 7.445 (ref 7.350–7.450)
pO2, Arterial: 80 mmHg (ref 80.0–100.0)

## 2014-07-03 LAB — CBC
HCT: 26.6 % — ABNORMAL LOW (ref 36.0–46.0)
Hemoglobin: 8.2 g/dL — ABNORMAL LOW (ref 12.0–15.0)
MCH: 28.9 pg (ref 26.0–34.0)
MCHC: 30.8 g/dL (ref 30.0–36.0)
MCV: 93.7 fL (ref 78.0–100.0)
PLATELETS: 270 10*3/uL (ref 150–400)
RBC: 2.84 MIL/uL — ABNORMAL LOW (ref 3.87–5.11)
RDW: 14 % (ref 11.5–15.5)
WBC: 10.6 10*3/uL — AB (ref 4.0–10.5)

## 2014-07-03 LAB — VANCOMYCIN, TROUGH: VANCOMYCIN TR: 18 ug/mL (ref 10.0–20.0)

## 2014-07-03 LAB — BASIC METABOLIC PANEL
ANION GAP: 5 (ref 5–15)
Anion gap: 7 (ref 5–15)
BUN: 7 mg/dL (ref 6–23)
BUN: 8 mg/dL (ref 6–23)
CALCIUM: 8.4 mg/dL (ref 8.4–10.5)
CO2: 29 mmol/L (ref 19–32)
CO2: 28 mmol/L (ref 19–32)
Calcium: 9.5 mg/dL (ref 8.4–10.5)
Chloride: 91 mEq/L — ABNORMAL LOW (ref 96–112)
Chloride: 101 mEq/L (ref 96–112)
Glucose, Bld: 495 mg/dL — ABNORMAL HIGH (ref 70–99)
Glucose, Bld: 138 mg/dL — ABNORMAL HIGH (ref 70–99)
POTASSIUM: 4.8 mmol/L (ref 3.5–5.1)
Potassium: 2.2 mmol/L — CL (ref 3.5–5.1)
SODIUM: 136 mmol/L (ref 135–145)
Sodium: 125 mmol/L — ABNORMAL LOW (ref 135–145)

## 2014-07-03 LAB — URINE CULTURE
COLONY COUNT: NO GROWTH
Culture: NO GROWTH
SPECIAL REQUESTS: NORMAL

## 2014-07-03 LAB — GLUCOSE, CAPILLARY
GLUCOSE-CAPILLARY: 130 mg/dL — AB (ref 70–99)
Glucose-Capillary: 134 mg/dL — ABNORMAL HIGH (ref 70–99)

## 2014-07-03 MED ORDER — DEXTROSE 5 % IV SOLN
1.0000 g | Freq: Three times a day (TID) | INTRAVENOUS | Status: DC
  Administered 2014-07-03 – 2014-07-06 (×9): 1 g via INTRAVENOUS
  Filled 2014-07-03 (×10): qty 1

## 2014-07-03 MED ORDER — POTASSIUM CHLORIDE 20 MEQ/15ML (10%) PO SOLN: 40.0000 meq | Freq: Every day | ORAL | Status: DC

## 2014-07-03 MED ORDER — POTASSIUM CHLORIDE 20 MEQ/15ML (10%) PO SOLN: 40.0000 meq | ORAL | Status: DC

## 2014-07-03 MED ORDER — PRO-STAT SUGAR FREE PO LIQD
30.0000 mL | Freq: Every day | ORAL | Status: DC
  Administered 2014-07-03 – 2014-07-26 (×24): 30 mL
  Filled 2014-07-03 (×25): qty 30

## 2014-07-03 MED ORDER — POTASSIUM CHLORIDE 20 MEQ/15ML (10%) PO SOLN
40.0000 meq | ORAL | Status: AC
  Administered 2014-07-03: 40 meq via ORAL
  Filled 2014-07-03: qty 30

## 2014-07-03 MED ORDER — POTASSIUM CHLORIDE 20 MEQ/15ML (10%) PO SOLN
40.0000 meq | ORAL | Status: AC
Start: 2014-07-03 — End: 2014-07-04
  Administered 2014-07-03 – 2014-07-04 (×4): 40 meq via ORAL
  Filled 2014-07-03 (×4): qty 30

## 2014-07-03 MED ORDER — JEVITY 1.2 CAL PO LIQD
1000.0000 mL | ORAL | Status: DC
  Administered 2014-07-03 – 2014-07-07 (×5): 1000 mL
  Administered 2014-07-08 – 2014-07-09 (×2)
  Administered 2014-07-10 – 2014-07-26 (×16): 1000 mL
  Filled 2014-07-03 (×37): qty 1000

## 2014-07-03 NOTE — Progress Notes (Signed)
07/03/2014 Care order was placed at 1539. Per Dr Tyson Alias cancel discharge for patient tomorrow. Case management and patient husband is aware. [facility] RN.

## 2014-07-03 NOTE — Progress Notes (Signed)
CRITICAL VALUE ALERT  Critical value received:  Blood Culture bottle (aerobic positive for gram negative rods)  Date of notification:  07/03/2014  Time of notification:  1433  Critical value read back:Yes.    Nurse who received alert:  Lovie Macadamia RN  MD notified (1st page):  Dr Rory Percy   Time of first page:  1500  MD notified (2nd page):  Time of second page:  Responding MD:  Dr Rory Percy   Time MD responded:  [phone]

## 2014-07-03 NOTE — Progress Notes (Signed)
07/03/14 Rica Koyanagi from [facility] called at [phone]. Blood culture bottle that was drawn 07/02/2014 the aerobic bottle was positive for gram negative rods. [facility] RN.

## 2014-07-03 NOTE — Progress Notes (Addendum)
PULMONARY / CRITICAL CARE MEDICINE   Name: Kathryn Booth MRN: [id number] DOB: 03-31-1971    ADMISSION DATE:  06/19/2014 CONSULTATION DATE:  07/03/2014  REFERRING MD :  [facility]  CHIEF COMPLAINT:  Tachycardia  INITIAL PRESENTATION:  44 y.o. 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  Recently admitted to [facility] (06/07/14).  Has had persistent tachycardia while at [facility].  Brought to [facility] ED 12/21 after husbands request for further evaluation of tachycardia.  In ED, UA suggestive of UTI.  STUDIES/SIGNIFICANT EVENTS: 10/12  Debridement of cervical abscess [facility]) 12/09  Admitted to [facility] 12/21  Admitted to [facility] with hx as above 12/27: No acute events  06/26/14: Husband definitely wants to take patient home. Care manager says things are being set up. Temp now 102.2F -- ongoing fevers 101-103F. Controlled wit tylenol.  1/3 plan for husband to take home 1/5?  SUBJECTIVE/OVERNIGHT/INTERVAL HX No change overnight.  No fever overnight or this am  VITAL SIGNS: Temp:  [97.6 F (36.4 C)-102.2 F (39 C)] 97.6 F (36.4 C) (01/04 0916) Pulse Rate:  [84-111] 92 (01/04 0916) Resp:  [15-19] 16 (01/04 0916) BP: (94-112)/(55-77) 104/72 mmHg (01/04 0916) SpO2:  [100 %] 100 % (01/04 0916) Weight:  [143 lb 4.8 oz (65 kg)] 143 lb 4.8 oz (65 kg) (01/04 0500)   VENTILATOR SETTINGS: Vent Mode:  [-] SIMV Set Rate:  [15 bmp] 15 bmp Vt Set:  [450 mL] 450 mL PEEP:  [5 cmH20] 5 cmH20 Pressure Support:  [10 cmH20] 10 cmH20   INTAKE / OUTPUT: Intake/Output      01/03 0701 - 01/04 0700 01/04 0701 - 01/05 0700   I.V. (mL/kg) 240 (3.7)    Other     NG/GT 1320    IV Piggyback 1050    Total Intake(mL/kg) 2610 (40.2)    Urine (mL/kg/hr) 2950 (1.9)    Total Output 2950     Net -340 0        Stool Occurrence 2 x      PHYSICAL EXAMINATION: General: wd female in nad Neuro: doesn't follow commands currently, quad HEENT: trach site clean, nose without purulence or  d/c Cardiovascular: minimal tachy with 3/6 sem Lungs: rhonchi noted, no wheezing Abdomen: soft, non tender, PEG site clean Ext: mild edema, no cyanosis   LABS:   CBC  Recent Labs Lab 06/29/14 0735 06/30/14 0500 07/03/14 0608  HGB 9.0* 8.9* 8.2*  HCT 28.1* 27.6* 26.6*  WBC 12.8* 10.7* 10.6*  PLT 258 280 270    COAGULATION No results for input(s): INR in the last 168 hours.  CARDIAC  No results for input(s): TROPONINI in the last 168 hours. No results for input(s): PROBNP in the last 168 hours.   CHEMISTRY  Recent Labs Lab 06/27/14 0416 06/28/14 0550 06/29/14 0518 06/29/14 0735 06/30/14 0500 07/03/14 0608  NA 136 135  --  137 136 125*  K 2.3* 3.6  --  3.2* 3.8 2.2*  CL 110 104  --  104 102 91*  CO2 17* 20  --  GLUCOSE 170* 128*  --  124* 108* 495*  BUN 11 12  --  CREATININE <0.30* <0.30*  --  <0.30* <0.30* <0.30*  CALCIUM 8.4 9.2  --  9.4 9.5 8.4  MG 1.5 1.9  --  2.0 2.1  --   PHOS 2.9 4.5 REQUEST CREDITED 6.0* 5.7*  --    CrCl cannot be calculated (This lab value for creatinine  cannot be used to calculate CrCl because it is not a number: <0.30).     IMAGING x48h Dg Chest Port 1 View  07/03/2014   CLINICAL DATA:  Fevers  EXAM: PORTABLE CHEST - 1 VIEW  COMPARISON:  06/30/2014  FINDINGS: Tracheostomy tube is again identified and stable. A new left-sided PICC line is noted with the tip at the cavoatrial junction. A left pulmonary arterial stent is again noted and stable. Postoperative changes are seen dense consolidation in the left lower lobe is again identified and stable. The right lung has improved aeration when compare with the prior study.  IMPRESSION: Improved aeration in the right lung.  Continued left lower lobe consolidation.   Electronically Signed   By: Alcide Clever M.D.   On: 07/03/2014 07:55     ASSESSMENT / PLAN:  PULMONARY INDWELLING DEVICES: Trach (chronic) A: Acute on chronic respiratory failure from PNA. (CT 12/28 -  dense LLL Pna with Small L Effusion )  S/p chronic tracheostomy  P:   Home vent in place Continue PS trials as tolerated.  T v resopnse good Follow serial c x r Ensure ABG on home vent has been done To note she did PS 20 last week, once home NO PS attempts  CARDIOVASCULAR RUE PICC  Dc 1/3 LUE PICC 1/3>> A:  Sinus tachycardia. Chronic hypotension.   -currently map 63 wit HR 120 P:  Continue lopressor Continue midodrine  INFECTIOUS Resp 12/22 >> Serratia >> R cefazolin, S otherwise Blood 12/22 >> Enterococcus 1/2 >> CT chest 12/28 - dense LLL consolidation resolve rapidly cdiff pos 12/29 1/3 bc>> 1/3 sputum>> GNR>> 1/3 UC>> A:   Serratia HCAP. Chronic cervical osteomyelitis and diskitis - on dicloxacillin through 08/14/14. Enterococcal bacteremia. Cdiff pos 12/29, temp better after treatment   P:   C Diff PCR 06/27/14>>>POS Cipro 12/21 >> 5 days then dc by ID. Restarted 1/3 per Dr. Shelle Iron Vancomycin 12/24 >> Oral Vanc 12/28>>> Dicloxacillin (long term for MSSA epidural abscess) >> 08/14/14  Restart cipro 1/3 1/3 pan culture enterococcus blood  = per ID no tee Continued vanc for entroccus (IV) Oral vanc for Cdiff to 500 mg New PICC line 1/3 rt>> ? Dc home 1/5 with husband(in wheel chair) as primary care giver.  Brett Canales Minor ACNP [facility] PCCM Pager [phone] till 3 pm If no answer page [phone] 07/03/2014, 11:41 AM  STAFF NOTE: I, Rory Percy, MD FACP have personally reviewed patient's available data, including medical history, events of note, physical examination and test results as part of my evaluation. I have discussed with resident/NP and other care providers such as pharmacist, RN and RRT. In addition, I personally evaluated patient and elicited key findings of: need stop dates for ABX per ID, ensure home vent abg done, NO PS weaning at home - get abg, bmet k ? Accurate?, repeat bmet, k supp given  Mcarthur Rossetti. Tyson Alias, MD, FACP Pgr: [phone] [place]  Pulmonary & Critical Care 07/03/2014 12:49 PM  '

## 2014-07-03 NOTE — Progress Notes (Signed)
CRITICAL VALUE ALERT  Critical value received:  Potassium 2.2  Date of notification:  07/03/14  Time of notification:  0719  Critical value read back: YES  Nurse who received alert:  Janice Norrie  MD notified (1st page):  Critical Care Pager  Time of first page:  [phone]  MD notified (2nd page):  Time of second page:  Responding MD:  Sung Amabile, MD  Time MD responded:  [phone]

## 2014-07-03 NOTE — Progress Notes (Signed)
Pt. placed to room air @ 1315 from 3 lpm bleed in to home vent with 100% sats for home Oxygen qualification, ABG drawn on room air after 20 minutes, sats only dropped to 97%, replaced pt. to 3 lpm after ABG drawl @ 1335, RT to monitor.

## 2014-07-03 NOTE — Progress Notes (Addendum)
ANTIBIOTIC CONSULT NOTE - Follow-Up Consult  Pharmacy Consult:  Vancomycin; Elita Quick Indication:  Enterococcus Bacteremia; GNR in blood 1/3  Allergies  Allergen Reactions  . Tape Rash   Patient Measurements: Height: 5' (152.4 cm) Weight: 143 lb 4.8 oz (65 kg) IBW/kg (Calculated) : 45.5  Vital Signs: Temp: 98.5 F (36.9 C) (01/04 1244) Temp Source: Oral (01/04 1244) BP: 113/71 mmHg (01/04 1300) Pulse Rate: 90 (01/04 1300) Intake/Output from previous day: 01/03 0701 - 01/04 0700 In: 2610 [I.V.:240; NG/GT:1320; IV Piggyback:1050] Out: 2950 [Urine:2950] Intake/Output from this shift:    Recent Labs  07/03/14 0608  WBC 10.6*  HGB 8.2*  PLT 270  CREATININE <0.30*   Microbiology: Recent Results (from the past 720 hour(s))  Urine culture     Status: None   Collection Time: 06/19/14  8:13 PM  Result Value Ref Range Status   Specimen Description URINE, CATHETERIZED  Final   Special Requests NONE  Final   Culture  Setup Time   Final    06/20/2014 04:19 Performed at [facility] Count   Final    30,000 COLONIES/ML Performed at [facility]    Culture   Final    Multiple bacterial morphotypes present, none predominant. Suggest appropriate recollection if clinically indicated. Performed at [facility]    Report Status 06/21/2014 FINAL  Final  MRSA PCR Screening     Status: None   Collection Time: 06/20/14 12:20 AM  Result Value Ref Range Status   MRSA by PCR NEGATIVE NEGATIVE Final    Comment:        The GeneXpert MRSA Assay (FDA approved for NASAL specimens only), is one component of a comprehensive MRSA colonization surveillance program. It is not intended to diagnose MRSA infection nor to guide or monitor treatment for MRSA infections.   Culture, blood (routine x 2)     Status: None (Preliminary result)   Collection Time: 06/20/14  1:09 AM  Result Value Ref Range Status   Specimen Description BLOOD LEFT HAND  Final   Special Requests BOTTLES DRAWN AEROBIC AND ANAEROBIC 5CC EA  Final   Culture  Setup Time   Final    06/20/2014 04:50 Performed at [facility]    Culture   Final    ENTEROCOCCUS SPECIES Note: Gram Stain Report Called to,Read Back By and Verified With: Tory Emerald RN [id number] Performed at [facility]    Report Status PENDING  Incomplete  Culture, blood (routine x 2)     Status: None (Preliminary result)   Collection Time: 06/20/14  1:16 AM  Result Value Ref Range Status   Specimen Description BLOOD RIGHT HAND  Final   Special Requests BOTTLES DRAWN AEROBIC AND ANAEROBIC 5CC EA  Final   Culture  Setup Time   Final    06/20/2014 04:47 Performed at [facility]    Culture   Final           BLOOD CULTURE RECEIVED NO GROWTH TO DATE CULTURE WILL BE HELD FOR 5 DAYS BEFORE ISSUING A FINAL NEGATIVE REPORT Performed at [facility]    Report Status PENDING  Incomplete  Culture, respiratory (NON-Expectorated)     Status: None   Collection Time: 06/20/14  1:18 AM  Result Value Ref Range Status   Specimen Description TRACHEAL ASPIRATE  Final   Special Requests NONE  Final   Gram Stain   Final    MODERATE WBC PRESENT, PREDOMINANTLY PMN RARE SQUAMOUS  EPITHELIAL CELLS PRESENT RARE GRAM NEGATIVE RODS Performed at [facility]    Culture   Final    ABUNDANT SERRATIA MARCESCENS Performed at [facility]    Report Status 06/22/2014 FINAL  Final   Organism ID, Bacteria SERRATIA MARCESCENS  Final      Susceptibility   Serratia marcescens - MIC*    CEFAZOLIN >=64 RESISTANT Resistant     CEFEPIME <=1 SENSITIVE Sensitive     CEFTAZIDIME <=1 SENSITIVE Sensitive     CEFTRIAXONE <=1 SENSITIVE Sensitive     CIPROFLOXACIN <=0.25 SENSITIVE Sensitive     GENTAMICIN <=1 SENSITIVE Sensitive     TOBRAMYCIN <=1 SENSITIVE Sensitive     TRIMETH/SULFA <=20 SENSITIVE Sensitive     * ABUNDANT SERRATIA MARCESCENS  Clostridium Difficile by PCR     Status:  Abnormal   Collection Time: 06/27/14 12:37 AM  Result Value Ref Range Status   C difficile by pcr POSITIVE (A) NEGATIVE Final    Comment: CRITICAL RESULT CALLED TO, READ BACK BY AND VERIFIED WITH: Durenda Age RN 9:50 06/27/14 ([email or web address])   Culture, respiratory (NON-Expectorated)     Status: None (Preliminary result)   Collection Time: 07/02/14 12:23 PM  Result Value Ref Range Status   Specimen Description TRACHEAL ASPIRATE  Final   Special Requests NONE  Final   Gram Stain   Final    FEW WBC PRESENT, PREDOMINANTLY PMN NO SQUAMOUS EPITHELIAL CELLS SEEN FEW GRAM NEGATIVE RODS Performed at [facility]    Culture   Final    Culture reincubated for better growth Performed at [facility]    Report Status PENDING  Incomplete  Culture, blood (routine x 2)     Status: None (Preliminary result)   Collection Time: 07/02/14  2:48 PM  Result Value Ref Range Status   Specimen Description BLOOD LEFT HAND  Final   Special Requests BOTTLES DRAWN AEROBIC ONLY 2CC  Final   Culture   Final           BLOOD CULTURE RECEIVED NO GROWTH TO DATE CULTURE WILL BE HELD FOR 5 DAYS BEFORE ISSUING A FINAL NEGATIVE REPORT Note: Culture results may be compromised due to an inadequate volume of blood received in culture bottles. Performed at [facility]    Report Status PENDING  Incomplete  Culture, blood (routine x 2)     Status: None (Preliminary result)   Collection Time: 07/02/14  6:30 PM  Result Value Ref Range Status   Specimen Description BLOOD RIGHT HAND  Final   Special Requests BOTTLES DRAWN AEROBIC AND ANAEROBIC 10CC  Final   Culture   Final    GRAM NEGATIVE RODS Note: Gram Stain Report Called to,Read Back By and Verified With: NADINE W@2 :33PM ON 07/03/14 BY DANTS Performed at [facility]    Report Status PENDING  Incomplete   Medical History: Past Medical History  Diagnosis Date  . Osteomyelitis   . Epidural abscess   . Respiratory failure   . Metabolic  encephalopathy   . Nonischemic cardiomyopathy   . Hypotension   . Osteomyelitis of vertebra of cervical region   . Bipolar 1 disorder   . Anxiety disorder   . Hypoxemia   . Bacteremia   . Hypoxic ischemic encephalopathy (HIE)   . Cutaneous abscess of back excluding buttocks   . Quadriplegia   . Dysphagia, oropharyngeal phase   . Tracheostomy in place    Assessment: 43yo quadriplegic female admitted from [facility] for evaluation of  tachycardia.  She has a complicated infectious history and is continued on dicloxacillin from PTA for history of cervical hardware/osteomyelitis. She is currently on Vancomycin 1g IV Q8h and dicloxacillin. Cipro treatment complete for serratia in trach aspirate. Patient remains febrile (Tmax 102) and WBC trending down.  Cipro 12/22 >>12/27; 1/3 >> Cefepime 12/23 >>12/24 Dicloxacillin PTA 12/15 >> (through 08/14/14) Fluc 12/22 >> 12/23 Vanc IV 12/24 >> (07/05/14) Vanc PO 12/29 >> (07/11/14)  Vanc trough was 14.7 on 12/28 on 1g IV Q8h Vancomycin trough today is 18 on 1g IV q8h.   1/3 Blood >> GNR 1/2 1/3 Resp >> reincubated 12/28 cdiff pos 12/22 TA cx - Serratia Marcescens (pan S except R to Ancef) 12/22 BCx x2 - 1/2 Enterococcus 12/21 UCx - neg  Plan:  Continue Vancomycin 1g IV Q8h  Continue Vanc  PO QID for Cdiff Monitor renal fxn, clinical progress Adding Fortaz 1g IV q8h.  Follow-up ability to narrow therapy as able  Thank you for allowing pharmacy to be a part of this patients care team.  Link Snuffer, PharmD, BCPS Clinical Pharmacist [phone]  07/03/2014 3:20 PM

## 2014-07-03 NOTE — Progress Notes (Addendum)
NUTRITION FOLLOW UP  Intervention:    Continue TF via PEG, change formula to Jevity 1.2 at 55 ml/h with Prostat 30 ml once daily to provide 1684 kcals, 88 gm protein, 1069 ml free water daily.   Nutrition Dx:   Inadequate oral intake related to inability to eat as evidenced by NPO status. Ongoing.  Goal:   Intake to meet >90% of estimated nutrition needs. Met.  Monitor:   TF tolerance/adequacy, weight trend, labs, vent status.  Assessment:   44 y.o. 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 Recently admitted to [place] (06/07/14). Has had persistent tachycardia while at [facility]. Brought to [facility] ED 12/21 after husbands request for further evaluation of tachycardia. In ED, UA suggestive of UTI.  Patient is receiving TF via PEG with Vital AF 1.2 at 55 ml/h (1320 ml/day) providing 1584 kcals, 99 gm protein, 1071 ml free water daily. Tolerating TF well at goal rate with no residuals. Per discussion with RN case manager, patient to be discharged home tomorrow. Will transition TF to a standard formula for home.  Patient with chronic trach on ventilator support MV: 8.8 L/min Temp (24hrs), Avg:100.3 F (37.9 C), Min:97.6 F (36.4 C), Max:102.2 F (39 C)  Propofol: none  Height: Ht Readings from Last 1 Encounters:  06/28/14 5' (1.524 m)    Weight Status:   Wt Readings from Last 1 Encounters:  07/03/14 143 lb 4.8 oz (65 kg)   06/26/14 136 lb 3.9 oz (61.8 kg)   06/20/14 138 lb 10.7 oz (62.9 kg)    Re-estimated needs:  Kcal: 1700 Protein: 85-100 gm Fluid: 1.6-1.8 L  Skin: intact  Diet Order: Diet NPO time specified   Intake/Output Summary (Last 24 hours) at 07/03/14 1114 Last data filed at 07/03/14 0900  Gross per 24 hour  Intake   2350 ml  Output   2250 ml  Net    100 ml    Last BM: 1/3  Labs:   Recent Labs Lab 06/28/14 0550 06/29/14 0518 06/29/14 0735 06/30/14 0500 07/03/14 0608  NA 135  --  137 136 125*  K 3.6  --  3.2*  3.8 2.2*  CL 104  --  104 102 91*  CO2 20  --  25 25 29   BUN 12  --  10 9 7   CREATININE <0.30*  --  <0.30* <0.30* <0.30*  CALCIUM 9.2  --  9.4 9.5 8.4  MG 1.9  --  2.0 2.1  --   PHOS 4.5 REQUEST CREDITED 6.0* 5.7*  --   GLUCOSE 128*  --  124* 108* 495*    CBG (last 3)   Recent Labs  07/02/14 1228 07/02/14 1820 07/03/14 0915  GLUCAP 106* 118* 130*    Scheduled Meds: . antiseptic oral rinse  7 mL Mouth Rinse QID  . chlorhexidine  15 mL Mouth Rinse BID  . ciprofloxacin  400 mg Intravenous Q12H  . dicloxacillin  500 mg Per Tube 3 times per day  . famotidine  20 mg Per Tube BID  . fentaNYL  25 mcg Transdermal Q72H  . heparin  5,000 Units Subcutaneous 3 times per day  . metoprolol tartrate  25 mg Per Tube Q8H  . midodrine  10 mg Per Tube TID WC  . potassium chloride  40 mEq Oral Q4H  . sodium chloride  10-40 mL Intracatheter Q12H  . vancomycin  500 mg Oral QID  . vancomycin  1,000 mg Intravenous Q8H    Continuous  Infusions: . sodium chloride 10 mL/hr at 07/01/14 1600  . feeding supplement (VITAL AF 1.2 CAL) 1,000 mL (07/03/14 0849)    Molli Barrows, RD, LDN, [place] Pager [phone] After Hours Pager [phone]

## 2014-07-04 DIAGNOSIS — A047 Enterocolitis due to Clostridium difficile: Secondary | ICD-10-CM

## 2014-07-04 LAB — GLUCOSE, CAPILLARY
GLUCOSE-CAPILLARY: 146 mg/dL — AB (ref 70–99)
GLUCOSE-CAPILLARY: 148 mg/dL — AB (ref 70–99)
GLUCOSE-CAPILLARY: 152 mg/dL — AB (ref 70–99)

## 2014-07-04 LAB — BASIC METABOLIC PANEL
ANION GAP: 10 (ref 5–15)
BUN: 7 mg/dL (ref 6–23)
CO2: 27 mmol/L (ref 19–32)
Calcium: 9.9 mg/dL (ref 8.4–10.5)
Chloride: 96 mEq/L (ref 96–112)
Creatinine, Ser: <0.3 mg/dL — ABNORMAL LOW (ref 0.50–1.10)
Glucose, Bld: 139 mg/dL — ABNORMAL HIGH (ref 70–99)
Potassium: 4.6 mmol/L (ref 3.5–5.1)
Sodium: 133 mmol/L — ABNORMAL LOW (ref 135–145)

## 2014-07-04 LAB — CBC
HCT: 30.8 % — ABNORMAL LOW (ref 36.0–46.0)
Hemoglobin: 9.7 g/dL — ABNORMAL LOW (ref 12.0–15.0)
MCH: 30.1 pg (ref 26.0–34.0)
MCHC: 31.5 g/dL (ref 30.0–36.0)
MCV: 95.7 fL (ref 78.0–100.0)
PLATELETS: 263 10*3/uL (ref 150–400)
RBC: 3.22 MIL/uL — AB (ref 3.87–5.11)
RDW: 14.1 % (ref 11.5–15.5)
WBC: 10.3 10*3/uL (ref 4.0–10.5)

## 2014-07-04 MED ORDER — FAMOTIDINE 40 MG/5ML PO SUSR
20.0000 mg | Freq: Every day | ORAL | Status: DC
  Administered 2014-07-05 – 2014-07-26 (×22): 20 mg
  Filled 2014-07-04 (×22): qty 2.5

## 2014-07-04 MED ORDER — VANCOMYCIN 50 MG/ML ORAL SOLUTION
500.0000 mg | Freq: Four times a day (QID) | ORAL | Status: AC
  Administered 2014-07-04 – 2014-07-10 (×26): 500 mg
  Filled 2014-07-04 (×26): qty 10

## 2014-07-04 NOTE — Progress Notes (Signed)
PULMONARY / CRITICAL CARE MEDICINE   Name: Kathryn DavidsonDana Harris MRN: [id number] DOB: March 19, 1971    ADMISSION DATE:  06/19/2014 CONSULTATION DATE:  07/04/2014  REFERRING MD :  [facility]  CHIEF COMPLAINT:  Tachycardia  INITIAL PRESENTATION:  44 y.o. 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  Recently admitted to [facility] (06/07/14).  Has had persistent tachycardia while at [facility].  [facility] after husbands request for further evaluation of tachycardia.  In ED, UA suggestive of UTI.  STUDIES/SIGNIFICANT EVENTS: 10/12  Debridement of cervical abscess [facility]) 12/09  Admitted to [facility] 12/21  [facility] hx as above 12/28 Husband definitely wants to take patient home. Care manager says things are being set up. Temp now 102.72F -- ongoing fevers 101-103F. Controlled wit tylenol.  12/28 CT chest: Dense left lower lobe collapse/ consolidation, suggestive of pneumonia. There is patchy less confluent airspace disease in the left upper lobe. 01/03 plan for husband to take home 1/5   INDWELLING DEVICES:: Chronic trach  RUE PICC (from [facility])  >> 1/03 LUE PICC 1/03 >>   MICRO DATA: Urine 12/21 >> 30k multiple organisms Resp 12/22 >> Serratia (pansens) Blood 12/22 >> 1/2 enterococcus C diff 12/29 >> POSITIVE Blood 01/03 >> 1/2 GNRs >>    CURRENT ANTIMICROBIALS:  Dicloxacillin (for chronic MSSA osteo) >> 08/14/14 (planned) Vanc 12/24 >> 01/05 Cipro 12/21 >> 12/26, 01/03 Enteral vanc 12/28 >>  Ceftaz 01/04 >>    SUBJECTIVE/OVERNIGHT/INTERVAL HX RASS 0. Tracks. Does not F/C. Tolerating home vent  VITAL SIGNS: Temp:  [98.2 F (36.8 C)-99.4 F (37.4 C)] 98.2 F (36.8 C) (01/05 1143) Pulse Rate:  [90-104] 104 (01/05 1143) Resp:  [14-22] 14 (01/05 1143) BP: (94-114)/(67-86) 105/67 mmHg (01/05 1143) SpO2:  [100 %] 100 % (01/05 1223) Weight:  [64.9 kg (143 lb 1.3 oz)] 64.9 kg (143 lb 1.3 oz) (01/05 0402)   VENTILATOR SETTINGS: Vent Mode:  [-]  SIMV Set Rate:  [15 bmp] 15 bmp Vt Set:  [450 mL] 450 mL PEEP:  [5 cmH20] 5 cmH20 Pressure Support:  [10 cmH20] 10 cmH20   INTAKE / OUTPUT: Intake/Output      01/04 0701 - 01/05 0700 01/05 0701 - 01/06 0700   I.V. (mL/kg) 130 (2) 80 (1.2)   NG/GT 55 440   IV Piggyback 1100 450   Total Intake(mL/kg) 1285 (19.8) 970 (14.9)   Urine (mL/kg/hr) 3325 (2.1) 800 (1.3)   Total Output 3325 800   Net -2040 +170        Urine Occurrence 275 x    Stool Occurrence 1 x      PHYSICAL EXAMINATION: General: NAD Neuro: RASS 0. Tracks with eyes, does not F/C HEENT: WNL Cardiovascular: reg, 3/6 systolic M Lungs: clear enteriorly Abdomen: soft, non tender, PEG site clean Ext: no edema, no cyanosis   LABS: I have reviewed all of today's lab results. Relevant abnormalities are discussed in the A/P section  CXR: NNF  ASSESSMENT / PLAN:  PULMONARY A: Chronic VDRF Chronic tracheostomy status HCAP P:   Cont home vent Cont vent bundle  CARDIOVASCULAR  A:  Sinus tachycardia, controlled Chronic hypotension, controlled on midodrine P:  Continue lopressor Continue midodrine  INFECTIOUS A:   Chronic cervical osteomyelitis and diskitis - on dicloxacillin through 08/14/14. Serratia HCAP Enterococcal bacteremia C diff colitis GNR bacteremia P:   Micro and abx as above   Billy Fischeravid Monaye Blackie, MD ; [facility] service Mobile [phone].  After 5:30 PM or weekends, call  [phone]  

## 2014-07-05 ENCOUNTER — Inpatient Hospital Stay (HOSPITAL_COMMUNITY): Payer: Medicare Other

## 2014-07-05 LAB — CULTURE, RESPIRATORY

## 2014-07-05 LAB — GLUCOSE, CAPILLARY
Glucose-Capillary: 168 mg/dL — ABNORMAL HIGH (ref 70–99)
Glucose-Capillary: 166 mg/dL — ABNORMAL HIGH (ref 70–99)
Glucose-Capillary: 149 mg/dL — ABNORMAL HIGH (ref 70–99)

## 2014-07-05 LAB — CULTURE, RESPIRATORY W GRAM STAIN

## 2014-07-05 NOTE — Progress Notes (Signed)
eLink Physician-Brief Progress Note Patient Name: Kathryn Booth DOB: 07-14-1970 MRN: [id number]   Date of Service  07/05/2014  HPI/Events of Note  Patient with acute drop in saturation to 40%, taken off vent and bagged up  eICU Interventions  Possible mucus plug, saturations appropriate now. Check CXR     Intervention Category Major Interventions: Respiratory failure - evaluation and management  Lamiah Marmol 07/05/2014, 3:43 PM

## 2014-07-05 NOTE — Progress Notes (Signed)
PULMONARY / CRITICAL CARE MEDICINE   Name: Kathryn Booth MRN: [id number] DOB: 06/23/1971    ADMISSION DATE:  06/19/2014 CONSULTATION DATE:  07/05/2014  REFERRING MD :  [facility]  CHIEF COMPLAINT:  Tachycardia  INITIAL PRESENTATION:  44 y.o. 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  Recently admitted to [facility] (06/07/14).  Has had persistent tachycardia while at [facility].  [facility] after husbands request for further evaluation of tachycardia.  In ED, UA suggestive of UTI.  STUDIES/SIGNIFICANT EVENTS: 10/12  Debridement of cervical abscess [facility] 12/09  Admitted to [facility] 12/21  [facility]x as above 12/28 Husband definitely wants to take patient home. Care manager says things are being set up. Temp now 102.85F -- ongoing fevers 101-103F. Controlled wit tylenol.  12/28 CT chest: Dense left lower lobe collapse/ consolidation, suggestive of pneumonia. There is patchy less confluent airspace disease in the left upper lobe. 01/06 Labile fevers  INDWELLING DEVICES:: Chronic trach  RUE PICC (from [facility])  >> 1/03 LUE PICC 1/03 >>   MICRO DATA: Urine 12/21 >> 30k multiple organisms Resp 12/22 >> Serratia (pansens) Blood 12/22 >> 1/2 enterococcus C diff 12/29 >> POSITIVE Blood 01/03 >> 1/2 GNRs >>    CURRENT ANTIMICROBIALS:  Dicloxacillin (for chronic MSSA osteo) >> 08/14/14 (planned) Vanc 12/24 >> 01/05 Cipro 12/21 >> 12/26, 01/03 >>  Enteral vanc 12/28 >>  Ceftaz 01/04 >>    SUBJECTIVE/OVERNIGHT/INTERVAL HX RASS -2. Does not F/C. Tolerating home vent  VITAL SIGNS: Temp:  [97.9 F (36.6 C)-102.4 F (39.1 C)] 100 F (37.8 C) (01/06 1236) Pulse Rate:  [95-123] 97 (01/06 1236) Resp:  [15-32] 15 (01/06 1236) BP: (97-110)/(67-82) 105/70 mmHg (01/06 1236) SpO2:  [99 %-100 %] 100 % (01/06 1236) Weight:  [65 kg (143 lb 4.8 oz)] 65 kg (143 lb 4.8 oz) (01/06 0500)   VENTILATOR SETTINGS: Vent Mode:  [-] SIMV Set Rate:  [15  bmp] 15 bmp Vt Set:  [450 mL] 450 mL PEEP:  [5 cmH20] 5 cmH20 Pressure Support:  [10 cmH20] 10 cmH20 Plateau Pressure:  [16 cmH20-26 cmH20] 26 cmH20   INTAKE / OUTPUT: Intake/Output      01/05 0701 - 01/06 0700 01/06 0701 - 01/07 0700   I.V. (mL/kg) 180 (2.8)    NG/GT 990    IV Piggyback 750    Total Intake(mL/kg) 1920 (29.5)    Urine (mL/kg/hr) 1750 (1.1) 200 (0.4)   Total Output 1750 200   Net +170 -200          PHYSICAL EXAMINATION: General: NAD Neuro: RASS -2. Does not F/C HEENT: WNL Cardiovascular: reg, 3/6 systolic M Lungs: clear enteriorly Abdomen: soft, non tender, PEG site clean Ext: no edema, no cyanosis   LABS: I have reviewed all of today's lab results. Relevant abnormalities are discussed in the A/P section  CXR: NNF  ASSESSMENT / PLAN:  PULMONARY A: Chronic VDRF Chronic tracheostomy status HCAP P:   Cont home vent Cont vent bundle  CARDIOVASCULAR  A:  Sinus tachycardia, controlled Chronic hypotension, controlled on midodrine P:  Continue lopressor Continue midodrine  INFECTIOUS A:   Chronic cervical osteomyelitis and diskitis - on dicloxacillin through 08/14/14. Serratia HCAP Enterococcal bacteremia C diff colitis GNR bacteremia Labile fevers P:   Micro and abx as above Cont antipyretics and PRN cooling blanket  Billy Fischeravid Rishita Petron, MD ; [facility] service Mobile [phone].  After 5:30 PM or weekends, call [phone]

## 2014-07-05 NOTE — Progress Notes (Signed)
Dr. Bard HerbertSimmonds made aware of fluctuating temps.

## 2014-07-06 DIAGNOSIS — B9561 Methicillin susceptible Staphylococcus aureus infection as the cause of diseases classified elsewhere: Secondary | ICD-10-CM

## 2014-07-06 DIAGNOSIS — Z1619 Resistance to other specified beta lactam antibiotics: Secondary | ICD-10-CM | POA: Insufficient documentation

## 2014-07-06 DIAGNOSIS — A0472 Enterocolitis due to Clostridium difficile, not specified as recurrent: Secondary | ICD-10-CM | POA: Insufficient documentation

## 2014-07-06 DIAGNOSIS — R6520 Severe sepsis without septic shock: Secondary | ICD-10-CM

## 2014-07-06 DIAGNOSIS — A498 Other bacterial infections of unspecified site: Secondary | ICD-10-CM | POA: Insufficient documentation

## 2014-07-06 DIAGNOSIS — R7881 Bacteremia: Secondary | ICD-10-CM | POA: Insufficient documentation

## 2014-07-06 DIAGNOSIS — A419 Sepsis, unspecified organism: Secondary | ICD-10-CM | POA: Insufficient documentation

## 2014-07-06 DIAGNOSIS — B961 Klebsiella pneumoniae [K. pneumoniae] as the cause of diseases classified elsewhere: Secondary | ICD-10-CM

## 2014-07-06 LAB — GLUCOSE, CAPILLARY
Glucose-Capillary: 118 mg/dL — ABNORMAL HIGH (ref 70–99)
Glucose-Capillary: 149 mg/dL — ABNORMAL HIGH (ref 70–99)
Glucose-Capillary: 116 mg/dL — ABNORMAL HIGH (ref 70–99)

## 2014-07-06 MED ORDER — FENTANYL CITRATE 0.05 MG/ML IJ SOLN
50.0000 ug | INTRAMUSCULAR | Status: AC | PRN
  Administered 2014-07-06 – 2014-07-09 (×10): 50 ug via INTRAVENOUS
  Filled 2014-07-06 (×10): qty 2

## 2014-07-06 MED ORDER — DEXTROSE 5 % IV SOLN
2.5000 g | Freq: Three times a day (TID) | INTRAVENOUS | Status: AC
  Administered 2014-07-06 – 2014-07-25 (×59): 2.5 g via INTRAVENOUS
  Filled 2014-07-06 (×76): qty 12

## 2014-07-06 NOTE — Progress Notes (Signed)
I notified Latoya, RN and Annalee GentaW Reynolds, Director 2C that appropriate PPE should be worn by all and UV light used at patient discharge.

## 2014-07-06 NOTE — Trach Care Team (Signed)
Trach Care Progression Note   Patient Details Name: Kathryn Booth MRN: [id number] DOB: April 08, 1971 Today's Date: 07/06/2014   Tracheostomy Assessment    Tracheostomy Shiley 6 mm Cuffed (Active)  Status Secured 07/06/2014 11:29 AM  Site Assessment Clean;Dry 07/06/2014 11:29 AM  Site Care Cleansed 07/06/2014  7:30 AM  Inner Cannula Care Changed/new 07/06/2014  4:57 AM  Ties Assessment Dry;Secure 07/06/2014 11:29 AM  Cuff pressure (cm) 22 cm 07/06/2014  4:02 AM  Emergency Equipment at bedside Yes 07/06/2014 11:29 AM     Care Needs     Respiratory Therapy O2 Device: Ventilator FiO2 (%):  (3 LPM) SpO2: 100 % Follow up recommendations: will continue to follow for progress Respiratory barriers to progression:  (pt remains on full vent support )    Speech Language Pathology  SLP chart review complete: Patient does not need SLP services at this time   Physical Therapy      Occupational Therapy      Nutritional Patient's Current Diet: Tube feeding Tube Feeding: Jevity 1.2 Cal Tube Feeding Frequency: Continuous Tube Feeding Strength: Full strength    Case Management/Social Work Level of patient care prior to hospitalization: LTACH ([facility]) Insurance payer: Medicaid Barriers to progression: GNR on blood cultures 1/5. Plan to address barriers: DC planned for 1/11 Anticipated discharge disposition: Home-Home Health [facility] to manage vent at home.)    Provider Trach Care Team/Provider Recommendations Trach Care Team Members Present-  Cherylin MylarLauren Doyle, RT, Shon BatonJenna Holloman, SW, Joaquin CourtsKimberly Harris, RD, Harlon DittyBonnie DeBlois, SLP, Nicolasa DuckingSally Holland, CM Anders SimmondsPete Babcock, NP Will need follow-up in trach clinic for tube replacement and possible evaluation for SLP services.          Nicklous Aburto, Silva BandyDebra Anita (scribe for team) 07/06/2014, 1:29 PM

## 2014-07-06 NOTE — Progress Notes (Signed)
eLink Physician-Brief Progress Note Patient Name: Kathryn Booth DOB: 19-Feb-1971 MRN: [id number]   Date of Service  07/06/2014  HPI/Events of Note  Patient in visible pain and crying from pain in back  eICU Interventions  Currently on fentanyl patch 25mcg. Ordered Fentanyl 50mcg IV q2hrs prn pain     Intervention Category Intermediate Interventions: Pain - evaluation and management  Youa Deloney 07/06/2014, 9:05 PM

## 2014-07-06 NOTE — Progress Notes (Signed)
CRITICAL VALUE ALERT  Critical value received:  Kleb Pneumoniae Date of notification: 07/06/14  Time of notification: 0844  Critical value read back: yes  Nurse who received alert:  Vincente PoliLaToya Francisca Langenderfer, RN MD notified (1st page):  [facility] pager  Time of first page: [phone]  MD notified (2nd page):Dr. Simmonds  Time of second [id number]  Responding MD: Dr. Bard HerbertSimmonds Time MD responded:  [phone]

## 2014-07-06 NOTE — Progress Notes (Signed)
[facility]    Subjective: Pt on ventilator trached   Antibiotics:  Anti-infectives    Start     Dose/Rate Route Frequency Ordered Stop   07/06/14 1400  ceftazidime-avibactam (AVYCAZ) 2.5 g in dextrose 5 % 50 mL IVPB     2.5 g25 mL/hr over 2 Hours Intravenous 3 times per day 07/06/14 0949     07/04/14 1800  vancomycin (VANCOCIN) 50 mg/mL oral solution 500 mg     500 mg Per Tube 4 times daily 07/04/14 1541 07/11/14 0959   07/03/14 1545  cefTAZidime (FORTAZ) 1 g in dextrose 5 % 50 mL IVPB  Status:  Discontinued     1 g100 mL/hr over 30 Minutes Intravenous Every 8 hours 07/03/14 1541 07/06/14 0949   07/02/14 1300  ciprofloxacin (CIPRO) IVPB 400 mg  Status:  Discontinued     400 mg200 mL/hr over 60 Minutes Intravenous Every 12 hours 07/02/14 1227 07/06/14 0949   06/28/14 1400  vancomycin (VANCOCIN) 50 mg/mL oral solution 500 mg  Status:  Discontinued     500 mg Oral 4 times daily 06/28/14 1251 07/04/14 1541   06/27/14 1015  vancomycin (VANCOCIN) 50 mg/mL oral solution 125 mg  Status:  Discontinued     125 mg Oral 4 times daily 06/27/14 1001 06/28/14 1251   06/25/14 1400  vancomycin (VANCOCIN) IVPB 1000 mg/200 mL premix  Status:  Discontinued     1,000 mg200 mL/hr over 60 Minutes Intravenous Every 8 hours 06/25/14 1318 07/04/14 1541   06/23/14 2200  ciprofloxacin (CIPRO) IVPB 400 mg     400 mg200 mL/hr over 60 Minutes Intravenous Every 12 hours 06/23/14 1530 06/24/14 2206   06/22/14 2300  vancomycin (VANCOCIN) IVPB 750 mg/150 ml premix  Status:  Discontinued     750 mg150 mL/hr over 60 Minutes Intravenous Every 12 hours 06/22/14 1134 06/25/14 1317   06/22/14 1130  vancomycin (VANCOCIN) IVPB 1000 mg/200 mL premix     1,000 mg200 mL/hr over 60 Minutes Intravenous  Once 06/22/14 1128 06/22/14 1315   06/21/14 1200  ceFEPIme (MAXIPIME) 1 g in dextrose 5 % 50 mL IVPB  Status:  Discontinued     1 g100 mL/hr over 30 Minutes Intravenous 3 times per day 06/21/14 1049  06/22/14 1012   06/21/14 1100  ceFEPIme (MAXIPIME) 1 g in dextrose 5 % 50 mL IVPB  Status:  Discontinued     1 g100 mL/hr over 30 Minutes Intravenous Every 12 hours 06/21/14 1047 06/21/14 1049   06/20/14 1000  ciprofloxacin (CIPRO) IVPB 400 mg  Status:  Discontinued     400 mg200 mL/hr over 60 Minutes Intravenous Every 12 hours 06/20/14 0919 06/23/14 1530   06/20/14 1000  fluconazole (DIFLUCAN) 40 MG/ML suspension 200 mg  Status:  Discontinued     200 mg Per Tube Daily 06/20/14 0934 06/21/14 1101   06/20/14 0600  dicloxacillin (DYNAPEN) capsule 500 mg     500 mg Per Tube 3 times per day 06/20/14 0219     06/20/14 0000  fluconazole (DIFLUCAN) IVPB 200 mg  Status:  Discontinued     200 mg100 mL/hr over 60 Minutes Intravenous Every 24 hours 06/19/14 2349 06/20/14 0934      Medications: Scheduled Meds: . antiseptic oral rinse  7 mL Mouth Rinse QID  . ceftazidime avibactam (AVYCAZ) IVPB  2.5 g Intravenous 3 times per day  . chlorhexidine  15 mL Mouth Rinse BID  . dicloxacillin  500 mg Per Tube 3  times per day  . famotidine  20 mg Per Tube Daily  . feeding supplement (PRO-STAT SUGAR FREE 64)  30 mL Per Tube Q1500  . fentaNYL  25 mcg Transdermal Q72H  . heparin  5,000 Units Subcutaneous 3 times per day  . metoprolol tartrate  25 mg Per Tube Q8H  . midodrine  10 mg Per Tube TID WC  . sodium chloride  10-40 mL Intracatheter Q12H  . vancomycin  500 mg Per Tube QID   Continuous Infusions: . sodium chloride 10 mL/hr at 07/06/14 0921  . feeding supplement (JEVITY 1.2 CAL) 1,000 mL (07/06/14 0718)   PRN Meds:.sodium chloride, acetaminophen (TYLENOL) oral liquid 160 mg/5 mL, metoprolol, sodium chloride    Objective: Weight change: -2 lb 13.9 oz (-1.3 kg)  Intake/Output Summary (Last 24 hours) at 07/06/14 1121 Last data filed at 07/06/14 1100  Gross per 24 hour  Intake 1936.5 ml  Output   2705 ml  Net -768.5 ml   Blood pressure 116/73, pulse 110, temperature 99.4 F (37.4 C),  temperature source Axillary, resp. rate 22, height 5' (1.524 m), weight 140 lb 6.9 oz (63.7 kg), SpO2 100 %. Temp:  [98.4 F (36.9 C)-100 F (37.8 C)] 99.4 F (37.4 C) (01/07 0838) Pulse Rate:  [97-134] 110 (01/07 0838) Resp:  [15-25] 22 (01/07 0838) BP: (104-125)/(68-85) 116/73 mmHg (01/07 0838) SpO2:  [93 %-100 %] 100 % (01/07 0838) Weight:  [140 lb 6.9 oz (63.7 kg)] 140 lb 6.9 oz (63.7 kg) (01/07 0700)  Physical Exam: General: Alert and awake appears to be watching small television that her husband brought HEENT: anicteric sclera,  EOMI CVS tachycardic rate, normal r,  no murmur rubs or gallops Chest: Coarse breath sounds throughout  Abdomen: soft mild diffuse Extremities: Contractures  Skin: PICC is clean  Neuro: Quadriplegic   CBC Latest Ref Rng 07/04/2014 07/03/2014 06/30/2014  WBC 4.0 - 10.5 K/uL 10.3 10.6(H) 10.7(H)  Hemoglobin 12.0 - 15.0 g/dL 1.6(X) 0.9(U) 8.9(L)  Hematocrit 36.0 - 46.0 % 30.8(L) 26.6(L) 27.6(L)  Platelets 150 - 400 K/uL 263 270 280      BMET  Recent Labs  07/03/14 1509 07/04/14 0500  NA 136 133*  K 4.8 4.6  CL 101 96  CO2 28 27  GLUCOSE 138* 139*  BUN 8 7  CREATININE <0.30* <0.30*  CALCIUM 9.5 9.9     Liver Panel  No results for input(s): PROT, ALBUMIN, AST, ALT, ALKPHOS, BILITOT, BILIDIR, IBILI in the last 72 hours.     Sedimentation Rate No results for input(s): ESRSEDRATE in the last 72 hours. C-Reactive Protein No results for input(s): CRP in the last 72 hours.  Micro Results: Recent Results (from the past 720 hour(s))  Urine culture     Status: None   Collection Time: 06/19/14  8:13 PM  Result Value Ref Range Status   Specimen Description URINE, CATHETERIZED  Final   Special Requests NONE  Final   Culture  Setup Time   Final    06/20/2014 04:19 Performed at [facility] Count   Final    30,000 COLONIES/ML Performed at [facility]    Culture   Final    Multiple bacterial morphotypes  present, none predominant. Suggest appropriate recollection if clinically indicated. Performed at [facility]    Report Status 06/21/2014 FINAL  Final  MRSA PCR Screening     Status: None   Collection Time: 06/20/14 12:20 AM  Result Value Ref Range Status  MRSA by PCR NEGATIVE NEGATIVE Final    Comment:        The GeneXpert MRSA Assay (FDA approved for NASAL specimens only), is one component of a comprehensive MRSA colonization surveillance program. It is not intended to diagnose MRSA infection nor to guide or monitor treatment for MRSA infections.   Culture, blood (routine x 2)     Status: None (Preliminary result)   Collection Time: 06/20/14  1:09 AM  Result Value Ref Range Status   Specimen Description BLOOD LEFT HAND  Final   Special Requests BOTTLES DRAWN AEROBIC AND ANAEROBIC 5CC EA  Final   Culture  Setup Time   Final    06/20/2014 04:50 Performed at [facility]    Culture   Final    ENTEROCOCCUS SPECIES Note: Gram Stain Report Called to,Read Back By and Verified With: Tory Emerald RN [id number] Performed at [facility]    Report Status PENDING  Incomplete  Culture, blood (routine x 2)     Status: None (Preliminary result)   Collection Time: 06/20/14  1:16 AM  Result Value Ref Range Status   Specimen Description BLOOD RIGHT HAND  Final   Special Requests BOTTLES DRAWN AEROBIC AND ANAEROBIC 5CC EA  Final   Culture  Setup Time   Final    06/20/2014 04:47 Performed at [facility]    Culture   Final           BLOOD CULTURE RECEIVED NO GROWTH TO DATE CULTURE WILL BE HELD FOR 5 DAYS BEFORE ISSUING A FINAL NEGATIVE REPORT Performed at [facility]    Report Status PENDING  Incomplete  Culture, respiratory (NON-Expectorated)     Status: None   Collection Time: 06/20/14  1:18 AM  Result Value Ref Range Status   Specimen Description TRACHEAL ASPIRATE  Final   Special Requests NONE  Final   Gram Stain   Final    MODERATE WBC  PRESENT, PREDOMINANTLY PMN RARE SQUAMOUS EPITHELIAL CELLS PRESENT RARE GRAM NEGATIVE RODS Performed at [facility]    Culture   Final    ABUNDANT SERRATIA MARCESCENS Performed at [facility]    Report Status 06/22/2014 FINAL  Final   Organism ID, Bacteria SERRATIA MARCESCENS  Final      Susceptibility   Serratia marcescens - MIC*    CEFAZOLIN >=64 RESISTANT Resistant     CEFEPIME <=1 SENSITIVE Sensitive     CEFTAZIDIME <=1 SENSITIVE Sensitive     CEFTRIAXONE <=1 SENSITIVE Sensitive     CIPROFLOXACIN <=0.25 SENSITIVE Sensitive     GENTAMICIN <=1 SENSITIVE Sensitive     TOBRAMYCIN <=1 SENSITIVE Sensitive     TRIMETH/SULFA <=20 SENSITIVE Sensitive     * ABUNDANT SERRATIA MARCESCENS  Clostridium Difficile by PCR     Status: Abnormal   Collection Time: 06/27/14 12:37 AM  Result Value Ref Range Status   C difficile by pcr POSITIVE (A) NEGATIVE Final    Comment: CRITICAL RESULT CALLED TO, READ BACK BY AND VERIFIED WITH: Durenda Age RN 9:50 06/27/14 ([email or web address])   Culture, respiratory (NON-Expectorated)     Status: None   Collection Time: 07/02/14 12:23 PM  Result Value Ref Range Status   Specimen Description TRACHEAL ASPIRATE  Final   Special Requests NONE  Final   Gram Stain   Final    FEW WBC PRESENT, PREDOMINANTLY PMN NO SQUAMOUS EPITHELIAL CELLS SEEN FEW GRAM NEGATIVE RODS Performed at [facility]   Final  ABUNDANT SERRATIA MARCESCENS Performed at [facility]    Report Status 07/05/2014 FINAL  Final   Organism ID, Bacteria SERRATIA MARCESCENS  Final      Susceptibility   Serratia marcescens - MIC*    CEFAZOLIN >=64 RESISTANT Resistant     CEFEPIME <=1 SENSITIVE Sensitive     CEFTAZIDIME <=1 SENSITIVE Sensitive     CEFTRIAXONE <=1 SENSITIVE Sensitive     CIPROFLOXACIN <=0.25 SENSITIVE Sensitive     GENTAMICIN <=1 SENSITIVE Sensitive     TOBRAMYCIN <=1 SENSITIVE Sensitive     TRIMETH/SULFA <=20 SENSITIVE Sensitive      * ABUNDANT SERRATIA MARCESCENS  Culture, blood (routine x 2)     Status: None (Preliminary result)   Collection Time: 07/02/14  2:48 PM  Result Value Ref Range Status   Specimen Description BLOOD LEFT HAND  Final   Special Requests BOTTLES DRAWN AEROBIC ONLY 2CC  Final   Culture   Final           BLOOD CULTURE RECEIVED NO GROWTH TO DATE CULTURE WILL BE HELD FOR 5 DAYS BEFORE ISSUING A FINAL NEGATIVE REPORT Note: Culture results may be compromised due to an inadequate volume of blood received in culture bottles. Performed at [facility]    Report Status PENDING  Incomplete  Urine culture     Status: None   Collection Time: 07/02/14  4:30 PM  Result Value Ref Range Status   Specimen Description URINE, RANDOM  Final   Special Requests Normal  Final   Colony Count NO GROWTH Performed at [facility]   Final   Culture NO GROWTH Performed at [facility]   Final   Report Status 07/03/2014 FINAL  Final  Culture, blood (routine x 2)     Status: None (Preliminary result)   Collection Time: 07/02/14  6:30 PM  Result Value Ref Range Status   Specimen Description BLOOD RIGHT HAND  Final   Special Requests BOTTLES DRAWN AEROBIC AND ANAEROBIC 10CC  Final   Culture   Final    KLEBSIELLA PNEUMONIAE Note: Confirmatory test for carbapenemase production is positive. COLISTIN 0.094ug/mL ETEST results for this drug are "FOR INVESTIGATIONAL USE ONLY" and should NOT be used for clinical purposes. CRITICAL RESULT CALLED TO, READ BACK BY AND VERIFIED WITH:  LATOYA HOWELL @ 0845 ON [id number] BY [facility] Note: Gram Stain Report Called to,Read Back By and Verified With: NADINE W@2 :33PM ON 07/03/14 BY DANTS Performed at [facility]    Report Status PENDING  Incomplete   Organism ID, Bacteria KLEBSIELLA PNEUMONIAE  Final      Susceptibility   Klebsiella pneumoniae - MIC*    AMPICILLIN >=32 RESISTANT Resistant     AMPICILLIN/SULBACTAM >=32 RESISTANT Resistant     CEFAZOLIN >=64  RESISTANT Resistant     CEFEPIME RESISTANT      CEFTAZIDIME >=64 RESISTANT Resistant     CEFTRIAXONE >=64 RESISTANT Resistant     CIPROFLOXACIN >=4 RESISTANT Resistant     GENTAMICIN 8 INTERMEDIATE Intermediate     IMIPENEM >=16 RESISTANT Resistant     PIP/TAZO >=128 RESISTANT Resistant     TOBRAMYCIN >=16 RESISTANT Resistant     TRIMETH/SULFA >=320 RESISTANT Resistant     * KLEBSIELLA PNEUMONIAE    Studies/Results: Dg Chest Port 1 View  07/05/2014   CLINICAL DATA:  Respiratory failure.  EXAM: PORTABLE CHEST - 1 VIEW  COMPARISON:  Single view of the chest 07/03/2014 and 06/30/2014.  FINDINGS: Endotracheal tube and left PICC  remain in place. The patient's PICC has with been withdrawn with the tip now projecting at the superior cavoatrial junction. There is volume loss in left chest with airspace disease in left mid and lower lung zones, unchanged. Mild atelectasis in the right base is noted. No pneumothorax identified. There is cardiomegaly.  IMPRESSION: Right PICC now projects in good position.  No change in airspace disease in the left mid and lower lung zones. No new abnormality.   Electronically Signed   By: Drusilla Kannerhomas  Dalessio M.D.   On: 07/05/2014 16:37      Assessment/Plan:  Active Problems:   UTI (lower urinary tract infection)   Respiratory failure   Sinus tachycardia   Pneumonia due to Serratia marcescens   Quadriplegia   Enterococcal bacteremia   Acute respiratory failure   Atelectasis of left lung   Chronic respiratory failure   Hypokalemia   Pneumonia    Kathryn Booth is a 44 y.o. 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  I'm hoping that the AVYCAZ will be active and effective (or that the organism was a contaminant) and that we do not have to reach for colistin As it can be notoriously hazardous in paralyzed patients with high risk for nephrotoxicity.  PROGNOSIS Is poor  I spent greater than 40 minutes with the patient including greater than 50% of time in face to face counsel of the patient and in coordination of their care.   #2 C. difficile colitis continue oral vancomycin, and would extend this during her period of extremely broad-spectrum antibiotics  #3 staph aureus cervical infection on Chronic dicloxacillin. Could consider changing her to doxycycline if the original organism was sensitive to it with a lower risk for C. difficile colitis than the dicloxacillin potentially and also with avoidance of double beta-lactam therapy that she has been having at present hospitalization       LOS: 17 days   Acey LavCornelius Van Dam 07/06/2014, 11:21 AM

## 2014-07-06 NOTE — Progress Notes (Signed)
Received call from infection control. Informed Clinical research associatewriter to update signs on the door. Enteric and contact sign on the door.

## 2014-07-06 NOTE — Progress Notes (Signed)
PULMONARY / CRITICAL CARE MEDICINE   Name: Kathryn Booth MRN: [id number] DOB: [birth year]    ADMISSION DATE:  06/19/2014 CONSULTATION DATE:  07/06/2014  REFERRING MD :  [facility]  CHIEF COMPLAINT:  Tachycardia  INITIAL PRESENTATION:  44 y.o. 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  Recently admitted to [facility] (06/07/14).  Has had persistent tachycardia while at [facility].  [facility] after husbands request for further evaluation of tachycardia.  In ED, UA suggestive of UTI.  STUDIES/SIGNIFICANT EVENTS: 10/12  Debridement of cervical abscess [facility] 12/09  Admitted to [facility] 12/21  [facility] as above 12/28 Husband definitely wants to take patient home. Care manager says things are being set up. Temp now 102.58F -- ongoing fevers 101-103F. Controlled wit tylenol.  12/28 CT chest: Dense left lower lobe collapse/ consolidation, suggestive of pneumonia. There is patchy less confluent airspace disease in the left upper lobe. 01/06 Labile fevers 01/07 Blood culture from 01/03 positive for ESBL and carbapenem resistant Klebsiella. ID consult. AVYCAZ initiated (ceftaz-avibactam)  INDWELLING DEVICES:: Chronic trach  RUE PICC (from [facility])  >> 1/03 LUE PICC 1/03 >>   MICRO DATA: Urine 12/21 >> 30k multiple organisms Resp 12/22 >> Serratia (pansens) Blood 12/22 >> 1/2 enterococcus C diff 12/29 >> POSITIVE Blood 01/03 >> 1/2 ESBL, carbapenem-resistent Klebsiella   CURRENT ANTIMICROBIALS:  Dicloxacillin (for chronic MSSA osteo) >> 08/14/14 (planned) Vanc 12/24 >> 01/05 Cipro 12/21 >> 12/26, 01/03 >> 01/07 Enteral vanc 12/28 >>  Ceftaz 01/04 >> 01/07 Ceftaz-avibactam (AVYCAZ) 01/07 >>    SUBJECTIVE/OVERNIGHT/INTERVAL HX RASS -2. Does not F/C. Tolerating home vent  VITAL SIGNS: Temp:  [98.4 F (36.9 C)-99.8 F (37.7 C)] 99.8 F (37.7 C) (01/07 1240) Pulse Rate:  [100-134] 117 (01/07 1652) Resp:  [15-25] 15 (01/07 1652) BP:  (104-125)/(68-85) 122/71 mmHg (01/07 1652) SpO2:  [93 %-100 %] 100 % (01/07 1652) Weight:  [63.7 kg (140 lb 6.9 oz)] 63.7 kg (140 lb 6.9 oz) (01/07 0700)   VENTILATOR SETTINGS: Vent Mode:  [-] SIMV Set Rate:  [15 bmp] 15 bmp Vt Set:  [450 mL] 450 mL PEEP:  [5 cmH20] 5 cmH20 Pressure Support:  [10 cmH20] 10 cmH20 Plateau Pressure:  [17 cmH20-24 cmH20] 17 cmH20   INTAKE / OUTPUT: Intake/Output      01/06 0701 - 01/07 0700 01/07 0701 - 01/08 0700   I.V. (mL/kg)     NG/GT 605 951.5   IV Piggyback 550 50   Total Intake(mL/kg) 1155 (18.1) 1001.5 (15.7)   Urine (mL/kg/hr) 2705 (1.8) 400 (0.6)   Total Output 2705 400   Net -1550 +601.5          PHYSICAL EXAMINATION: General: NAD Neuro: RASS -2. Does not F/C HEENT: WNL Cardiovascular: reg, 3/6 systolic M Lungs: clear enteriorly Abdomen: soft, non tender, PEG site clean Ext: no edema, no cyanosis   LABS: I have reviewed all of today's lab results. Relevant abnormalities are discussed in the A/P section  CXR: NNF  ASSESSMENT / PLAN:  PULMONARY A: Chronic VDRF Chronic tracheostomy status HCAP P:   Cont home vent Cont vent bundle  CARDIOVASCULAR  A:  Sinus tachycardia, controlled Chronic hypotension, controlled on midodrine P:  Continue lopressor Continue midodrine  INFECTIOUS A:   Chronic cervical osteomyelitis and diskitis - on dicloxacillin through 08/14/14. Serratia HCAP Enterococcal bacteremia C diff colitis Klebsiella bacteremia - highly resistant (ESBL and carbapenemase producing) Labile fevers P:   Micro and abx per ID  Cont antipyretics and PRN cooling blanket  Husband updated in detail @ bedside  Billy Fischer, MD ; [facility] [phone].  After 5:30 PM or weekends, call [phone]

## 2014-07-07 DIAGNOSIS — A4150 Gram-negative sepsis, unspecified: Secondary | ICD-10-CM

## 2014-07-07 LAB — CULTURE, BLOOD (ROUTINE X 2)

## 2014-07-07 LAB — GLUCOSE, CAPILLARY
GLUCOSE-CAPILLARY: 129 mg/dL — AB (ref 70–99)
Glucose-Capillary: 137 mg/dL — ABNORMAL HIGH (ref 70–99)
Glucose-Capillary: 118 mg/dL — ABNORMAL HIGH (ref 70–99)

## 2014-07-07 MED ORDER — METOPROLOL TARTRATE 50 MG PO TABS
50.0000 mg | ORAL_TABLET | Freq: Two times a day (BID) | ORAL | Status: DC
  Administered 2014-07-07 – 2014-07-26 (×38): 50 mg
  Filled 2014-07-07 (×39): qty 1

## 2014-07-07 MED ORDER — MIDODRINE HCL 5 MG PO TABS
5.0000 mg | ORAL_TABLET | Freq: Three times a day (TID) | ORAL | Status: DC
  Administered 2014-07-07 – 2014-07-26 (×58): 5 mg
  Filled 2014-07-07 (×60): qty 1

## 2014-07-07 NOTE — Progress Notes (Signed)
ANTIBIOTIC CONSULT NOTE - FOLLOW UP  Pharmacy Consult for Ceftaz/Avibactam Kathryn Booth(Avycaz) Indication: Bacteremia  Allergies  Allergen Reactions  . Tape Rash    Patient Measurements: Height: 5' (152.4 cm) Weight: 140 lb 6.9 oz (63.7 kg) IBW/kg (Calculated) : 45.5 Adjusted Body Weight:    Assessment: 43 YOF admitted from [facility] for evaluation of tachycardia. Complicated infectious history and is on dicloxacillin from PTA for history of cervical hardware/osteomyelitis. Quadriplegic d/t cervical abscess s/p debridement 04/10/14; SCr is falsely low.  AC: Heparin SQ. CBC ok stable.  ID: Vanc pofor +cdiff. Lifetime dicloxacillin for cervical hardware/osteo (hx osteo, epidural abscess, fungemia). Avycaz for Serratia HCAP, Klebsiella bacteremia. Tmax 101.6. WBC 10.3. ID following. *Quadriplegic* Changed PICC 1/3.  Cipro 12/22 >>12/27; 1/3 >>1/7 Cefepime 12/23 >>12/24 Dicloxacillin PTA 12/15 >> (08/14/14) Fluc 12/22 >> 12/23 Vanc 12/24 >> 1/5  12/27: VT 7.3 on 750mg  q12  12/28: VT 14.7 on 1g q8    1/4: VT 18 on 1g q8 Vanc po 12/29>>(07/11/14) Elita QuickFortaz 1/4 >>1/7 Kathryn HolmAvycaz 1/7 >>  1/3 Blood >> 1/2 Klebsiella (+carbapenemase production). (Inter- Natasha BenceGent, otherwise R)- Sensitive to [facility]az testing. 1/3 Resp >> abundant Serratia marcescens (pan S except R to Ancef) 12/28 cdiff pos 12/22 TA cx - Serratia Marcescens (pan S except R to Ancef) 12/22 BCx x2 - 1/2 Enterococcus 12/21 UCx - neg  Cards: NICM. BP soft, ST, HR 110. midodrine + Lopressor  Endo: TSH mildly elev (CCM doesn't want to check free T3/T4). No hx DM, gluc 116-149  GI: NPO. 12/23 ALT elevated, other LFTs WNL. Vital AF + Pepcid PT (min residuals).  Neuro: Bipolar/anxiety. Cervical abscess s/p I&D 10/12.  Renal: quadriplegia d/t cervical abscess. SCr < 0.3, K 4.6, Na 125>>133, UOP 0.9  Pulm: Chronic trach/vent, intubated, FiO2 30%, SIMV- failed weaning.  Heme: hgb 8.2, plts WNL  PTA Medication Issues: diltiazem,  KCl  Best Practices: heparin SQ, MC, pepcid   Plan:  PO vanc 500mg  QID for CDiff x 14 days Avycaz (ceftaz-avibactam) 2.5g IV q8h- in stock, ordering enough to get through weekend   UAL CorporationCrystal S. Merilynn Booth, PharmD, BCPS Clinical Staff Pharmacist Pager [phone]  Kathryn Booth, Kathryn Booth 07/07/2014,2:04 PM

## 2014-07-07 NOTE — Progress Notes (Signed)
Speech Pathology  Have discussed possibilty of in-line PMSV for this pt with trach rounding team. Unsure if pt has had this in the past. Tried to contact pts husband for information x2 with no success. If pts husband available please page me.  Thanks, Harlon DittyBonnie Dejanira Pamintuan, MA CCC-SLP [phone]

## 2014-07-07 NOTE — Progress Notes (Signed)
PULMONARY / CRITICAL CARE MEDICINE   Name: Kathryn Booth MRN: [id number] DOB: April 21, 1971    ADMISSION DATE:  06/19/2014 CONSULTATION DATE:  07/07/2014  [facility]ERRING MD :  [facility]  CHIEF COMPLAINT:  Tachycardia  INITIAL PRESENTATION:  44 y.o. 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  Recently admitted to [facility] (06/07/14).  Has had persistent tachycardia while at [facility].  Brought to [facility] ED 12/21 after husbands request for further evaluation of tachycardia.  In ED, UA suggestive of UTI.  STUDIES/SIGNIFICANT EVENTS: 10/12  Debridement of cervical abscess [facility]) 12/09  Admitted to [facility] 12/21  Admitted to [facility] with hx as above 12/28 Husband definitely wants to take patient home. Care manager says things are being set up. Temp now 102.26F -- ongoing fevers 101-103F. Controlled wit tylenol.  12/28 CT chest: Dense left lower lobe collapse/ consolidation, suggestive of pneumonia. There is patchy less confluent airspace disease in the left upper lobe. 01/06 Labile fevers 01/07 Blood culture from 01/03 positive for ESBL and carbapenem resistant Klebsiella. ID consult. AVYCAZ initiated (ceftaz-avibactam)  INDWELLING DEVICES:: Chronic trach  RUE PICC (from [facility])  >> 1/03 LUE PICC 1/03 >>   MICRO DATA: Urine 12/21 >> 30k multiple organisms Resp 12/22 >> Serratia (pansens) Blood 12/22 >> 1/2 enterococcus C diff 12/29 >> POSITIVE Blood 01/03 >> 1/2 ESBL, carbapenem-resistent Klebsiella   CURRENT ANTIMICROBIALS:  Dicloxacillin (for chronic MSSA osteo) >> 08/14/14 (planned) Vanc 12/24 >> 01/05 Cipro 12/21 >> 12/26, 01/03 >> 01/07 Enteral vanc 12/28 >>  Ceftaz 01/04 >> 01/07 Ceftaz-avibactam (AVYCAZ) 01/07 >>    SUBJECTIVE/OVERNIGHT/INTERVAL HX Minimal change. Eyes open. Appears to regard examiner but not F/C  VITAL SIGNS: Temp:  [98.3 F (36.8 C)-101.6 F (38.7 C)] 98.8 F (37.1 C) (01/08 1230) Pulse Rate:  [98-125] 110 (01/08 1230) Resp:  [14-33] 15  (01/08 1230) BP: (107-149)/(64-95) 111/83 mmHg (01/08 1230) SpO2:  [96 %-100 %] 100 % (01/08 1230)   VENTILATOR SETTINGS: Vent Mode:  [-] SIMV Set Rate:  [15 bmp] 15 bmp Vt Set:  [450 mL] 450 mL PEEP:  [5 cmH20] 5 cmH20 Pressure Support:  [10 cmH20] 10 cmH20 Plateau Pressure:  [14 cmH20-16 cmH20] 15 cmH20   INTAKE / OUTPUT: Intake/Output      01/07 0701 - 01/08 0700 01/08 0701 - 01/09 0700   I.V. (mL/kg) 450 (7.1)    NG/GT 1691.5    IV Piggyback 150    Total Intake(mL/kg) 2291.5 (36)    Urine (mL/kg/hr) 1475 (1) 225 (0.5)   Total Output 1475 225   Net +816.5 -225        Stool Occurrence 1 x      PHYSICAL EXAMINATION: General: NAD Neuro: RASS 0. Does not F/C HEENT: WNL Cardiovascular: hyperdynamic precordium, reg, 3/6 systolic M Lungs: clear enteriorly Abdomen: soft, non tender, PEG site clean Ext: no edema, no cyanosis   LABS: I have reviewed all of today's lab results. Relevant abnormalities are discussed in the A/P section  CXR: NNF  ASSESSMENT / PLAN:  PULMONARY A: Chronic VDRF Chronic tracheostomy status HCAP P:   Cont home vent Cont vent bundle  CARDIOVASCULAR  A:  Sinus tachycardia, controlled Chronic hypotension, controlled on midodrine P:  Increase metoprolol 01/08 Decrase midodrine 01/08  INFECTIOUS A:   Chronic cervical osteomyelitis/diskitis - on dicloxacillin through 08/14/14. Serratia HCAP Enterococcal bacteremia, treated C diff colitis Klebsiella bacteremia - highly resistant (ESBL and carbapenemase producing) Labile fevers - improving P:   Micro and abx per ID Cont  antipyretics and cooling blanket as needed  Anticipate DC to home early part of next week [facility]  Kathryn Fischeravid Nathin Saran, MD ; [facility] service Mobile [phone].  After 5:30 PM or weekends, call [phone]

## 2014-07-07 NOTE — Progress Notes (Signed)
07/07/2014 order was given to place iv in and remove PICC line, by Dr Daiva EvesVan Dam. IV team was called, but unable to place iv. Dr Daiva EvesVan Dam is aware that [facility] is still in. [facility] Rafan Sanders RN.

## 2014-07-07 NOTE — Progress Notes (Signed)
Multiple assessments by IV Team for PIV start without success. Floor RN aware, to notify MD.

## 2014-07-07 NOTE — Progress Notes (Signed)
[facility]    Subjective: Pt on ventilator trached   Antibiotics:  Anti-infectives    Start     Dose/Rate Route Frequency Ordered Stop   07/06/14 1400  ceftazidime-avibactam (AVYCAZ) 2.5 g in dextrose 5 % 50 mL IVPB     2.5 g25 mL/hr over 2 Hours Intravenous 3 times per day 07/06/14 0949     07/04/14 1800  vancomycin (VANCOCIN) 50 mg/mL oral solution 500 mg     500 mg Per Tube 4 times daily 07/04/14 1541 07/11/14 0959   07/03/14 1545  cefTAZidime (FORTAZ) 1 g in dextrose 5 % 50 mL IVPB  Status:  Discontinued     1 g100 mL/hr over 30 Minutes Intravenous Every 8 hours 07/03/14 1541 07/06/14 0949   07/02/14 1300  ciprofloxacin (CIPRO) IVPB 400 mg  Status:  Discontinued     400 mg200 mL/hr over 60 Minutes Intravenous Every 12 hours 07/02/14 1227 07/06/14 0949   06/28/14 1400  vancomycin (VANCOCIN) 50 mg/mL oral solution 500 mg  Status:  Discontinued     500 mg Oral 4 times daily 06/28/14 1251 07/04/14 1541   06/27/14 1015  vancomycin (VANCOCIN) 50 mg/mL oral solution 125 mg  Status:  Discontinued     125 mg Oral 4 times daily 06/27/14 1001 06/28/14 1251   06/25/14 1400  vancomycin (VANCOCIN) IVPB 1000 mg/200 mL premix  Status:  Discontinued     1,000 mg200 mL/hr over 60 Minutes Intravenous Every 8 hours 06/25/14 1318 07/04/14 1541   06/23/14 2200  ciprofloxacin (CIPRO) IVPB 400 mg     400 mg200 mL/hr over 60 Minutes Intravenous Every 12 hours 06/23/14 1530 06/24/14 2206   06/22/14 2300  vancomycin (VANCOCIN) IVPB 750 mg/150 ml premix  Status:  Discontinued     750 mg150 mL/hr over 60 Minutes Intravenous Every 12 hours 06/22/14 1134 06/25/14 1317   06/22/14 1130  vancomycin (VANCOCIN) IVPB 1000 mg/200 mL premix     1,000 mg200 mL/hr over 60 Minutes Intravenous  Once 06/22/14 1128 06/22/14 1315   06/21/14 1200  ceFEPIme (MAXIPIME) 1 g in dextrose 5 % 50 mL IVPB  Status:  Discontinued     1 g100 mL/hr over 30 Minutes Intravenous 3 times per day 06/21/14 1049  06/22/14 1012   06/21/14 1100  ceFEPIme (MAXIPIME) 1 g in dextrose 5 % 50 mL IVPB  Status:  Discontinued     1 g100 mL/hr over 30 Minutes Intravenous Every 12 hours 06/21/14 1047 06/21/14 1049   06/20/14 1000  ciprofloxacin (CIPRO) IVPB 400 mg  Status:  Discontinued     400 mg200 mL/hr over 60 Minutes Intravenous Every 12 hours 06/20/14 0919 06/23/14 1530   06/20/14 1000  fluconazole (DIFLUCAN) 40 MG/ML suspension 200 mg  Status:  Discontinued     200 mg Per Tube Daily 06/20/14 0934 06/21/14 1101   06/20/14 0600  dicloxacillin (DYNAPEN) capsule 500 mg     500 mg Per Tube 3 times per day 06/20/14 0219     06/20/14 0000  fluconazole (DIFLUCAN) IVPB 200 mg  Status:  Discontinued     200 mg100 mL/hr over 60 Minutes Intravenous Every 24 hours 06/19/14 2349 06/20/14 0934      Medications: Scheduled Meds: . antiseptic oral rinse  7 mL Mouth Rinse QID  . ceftazidime avibactam (AVYCAZ) IVPB  2.5 g Intravenous 3 times per day  . chlorhexidine  15 mL Mouth Rinse BID  . dicloxacillin  500 mg Per Tube 3  times per day  . famotidine  20 mg Per Tube Daily  . feeding supplement (PRO-STAT SUGAR FREE 64)  30 mL Per Tube Q1500  . fentaNYL  25 mcg Transdermal Q72H  . heparin  5,000 Units Subcutaneous 3 times per day  . metoprolol tartrate  50 mg Per Tube BID  . midodrine  5 mg Per Tube TID WC  . sodium chloride  10-40 mL Intracatheter Q12H  . vancomycin  500 mg Per Tube QID   Continuous Infusions: . sodium chloride 10 mL/hr at 07/06/14 0921  . feeding supplement (JEVITY 1.2 CAL) 1,000 mL (07/07/14 0446)   PRN Meds:.sodium chloride, acetaminophen (TYLENOL) oral liquid 160 mg/5 mL, fentaNYL, metoprolol, sodium chloride    Objective: Weight change:   Intake/Output Summary (Last 24 hours) at 07/07/14 1525 Last data filed at 07/07/14 1255  Gross per 24 hour  Intake    965 ml  Output   1300 ml  Net   -335 ml   Blood pressure 109/78, pulse 124, temperature 98.8 F (37.1 C), temperature source  Oral, resp. rate 20, height 5' (1.524 m), weight 140 lb 6.9 oz (63.7 kg), SpO2 100 %. Temp:  [98.3 F (36.8 C)-101.6 F (38.7 C)] 98.8 F (37.1 C) (01/08 1230) Pulse Rate:  [98-125] 124 (01/08 1459) Resp:  [14-33] 20 (01/08 1459) BP: (107-149)/(64-95) 109/78 mmHg (01/08 1459) SpO2:  [96 %-100 %] 100 % (01/08 1459)  Physical Exam: General: Alert and awake  HEENT: anicteric sclera,  EOMI CVS tachycardic rate, normal r,  no murmur rubs or gallops Chest: Coarse breath sounds throughout  Abdomen: soft mild diffuse Extremities: Contractures  Skin: PICC is clean  Neuro: Quadriplegic   CBC Latest Ref Rng 07/04/2014 07/03/2014 06/30/2014  WBC 4.0 - 10.5 K/uL 10.3 10.6(H) 10.7(H)  Hemoglobin 12.0 - 15.0 g/dL 8.1(X) 9.1(Y) 8.9(L)  Hematocrit 36.0 - 46.0 % 30.8(L) 26.6(L) 27.6(L)  Platelets 150 - 400 K/uL 263 270 280      BMET No results for input(s): NA, K, CL, CO2, GLUCOSE, BUN, CREATININE, CALCIUM in the last 72 hours.   Liver Panel  No results for input(s): PROT, ALBUMIN, AST, ALT, ALKPHOS, BILITOT, BILIDIR, IBILI in the last 72 hours.     Sedimentation Rate No results for input(s): ESRSEDRATE in the last 72 hours. C-Reactive Protein No results for input(s): CRP in the last 72 hours.  Micro Results: Recent Results (from the past 720 hour(s))  Urine culture     Status: None   Collection Time: 06/19/14  8:13 PM  Result Value Ref Range Status   Specimen Description URINE, CATHETERIZED  Final   Special Requests NONE  Final   Culture  Setup Time   Final    06/20/2014 04:19 Performed at [facility] Count   Final    30,000 COLONIES/ML Performed at [facility]    Culture   Final    Multiple bacterial morphotypes present, none predominant. Suggest appropriate recollection if clinically indicated. Performed at [facility]    Report Status 06/21/2014 FINAL  Final  MRSA PCR Screening     Status: None   Collection Time: 06/20/14 12:20 AM    Result Value Ref Range Status   MRSA by PCR NEGATIVE NEGATIVE Final    Comment:        The GeneXpert MRSA Assay (FDA approved for NASAL specimens only), is one component of a comprehensive MRSA colonization surveillance program. It is not intended to diagnose MRSA infection nor  to guide or monitor treatment for MRSA infections.   Culture, blood (routine x 2)     Status: None (Preliminary result)   Collection Time: 06/20/14  1:09 AM  Result Value Ref Range Status   Specimen Description BLOOD LEFT HAND  Final   Special Requests BOTTLES DRAWN AEROBIC AND ANAEROBIC 5CC EA  Final   Culture  Setup Time   Final    06/20/2014 04:50 Performed at [facility]    Culture   Final    ENTEROCOCCUS SPECIES Note: Gram Stain Report Called to,Read Back By and Verified With: Tory Emerald RN [id number] Performed at [facility]    Report Status PENDING  Incomplete  Culture, blood (routine x 2)     Status: None (Preliminary result)   Collection Time: 06/20/14  1:16 AM  Result Value Ref Range Status   Specimen Description BLOOD RIGHT HAND  Final   Special Requests BOTTLES DRAWN AEROBIC AND ANAEROBIC 5CC EA  Final   Culture  Setup Time   Final    06/20/2014 04:47 Performed at [facility]    Culture   Final           BLOOD CULTURE RECEIVED NO GROWTH TO DATE CULTURE WILL BE HELD FOR 5 DAYS BEFORE ISSUING A FINAL NEGATIVE REPORT Performed at [facility]    Report Status PENDING  Incomplete  Culture, respiratory (NON-Expectorated)     Status: None   Collection Time: 06/20/14  1:18 AM  Result Value Ref Range Status   Specimen Description TRACHEAL ASPIRATE  Final   Special Requests NONE  Final   Gram Stain   Final    MODERATE WBC PRESENT, PREDOMINANTLY PMN RARE SQUAMOUS EPITHELIAL CELLS PRESENT RARE GRAM NEGATIVE RODS Performed at [facility]    Culture   Final    ABUNDANT SERRATIA MARCESCENS Performed at [facility]    Report Status  06/22/2014 FINAL  Final   Organism ID, Bacteria SERRATIA MARCESCENS  Final      Susceptibility   Serratia marcescens - MIC*    CEFAZOLIN >=64 RESISTANT Resistant     CEFEPIME <=1 SENSITIVE Sensitive     CEFTAZIDIME <=1 SENSITIVE Sensitive     CEFTRIAXONE <=1 SENSITIVE Sensitive     CIPROFLOXACIN <=0.25 SENSITIVE Sensitive     GENTAMICIN <=1 SENSITIVE Sensitive     TOBRAMYCIN <=1 SENSITIVE Sensitive     TRIMETH/SULFA <=20 SENSITIVE Sensitive     * ABUNDANT SERRATIA MARCESCENS  Clostridium Difficile by PCR     Status: Abnormal   Collection Time: 06/27/14 12:37 AM  Result Value Ref Range Status   C difficile by pcr POSITIVE (A) NEGATIVE Final    Comment: CRITICAL RESULT CALLED TO, READ BACK BY AND VERIFIED WITH: Durenda Age RN 9:50 06/27/14 ([email or web address])   Culture, respiratory (NON-Expectorated)     Status: None   Collection Time: 07/02/14 12:23 PM  Result Value Ref Range Status   Specimen Description TRACHEAL ASPIRATE  Final   Special Requests NONE  Final   Gram Stain   Final    FEW WBC PRESENT, PREDOMINANTLY PMN NO SQUAMOUS EPITHELIAL CELLS SEEN FEW GRAM NEGATIVE RODS Performed at [facility]    Culture   Final    ABUNDANT SERRATIA MARCESCENS Performed at [facility]    Report Status 07/05/2014 FINAL  Final   Organism ID, Bacteria SERRATIA MARCESCENS  Final      Susceptibility   Serratia marcescens - MIC*    CEFAZOLIN >=64  RESISTANT Resistant     CEFEPIME <=1 SENSITIVE Sensitive     CEFTAZIDIME <=1 SENSITIVE Sensitive     CEFTRIAXONE <=1 SENSITIVE Sensitive     CIPROFLOXACIN <=0.25 SENSITIVE Sensitive     GENTAMICIN <=1 SENSITIVE Sensitive     TOBRAMYCIN <=1 SENSITIVE Sensitive     TRIMETH/SULFA <=20 SENSITIVE Sensitive     * ABUNDANT SERRATIA MARCESCENS  Culture, blood (routine x 2)     Status: None (Preliminary result)   Collection Time: 07/02/14  2:48 PM  Result Value Ref Range Status   Specimen Description BLOOD LEFT HAND  Final   Special  Requests BOTTLES DRAWN AEROBIC ONLY 2CC  Final   Culture   Final           BLOOD CULTURE RECEIVED NO GROWTH TO DATE CULTURE WILL BE HELD FOR 5 DAYS BEFORE ISSUING A FINAL NEGATIVE REPORT Note: Culture results may be compromised due to an inadequate volume of blood received in culture bottles. Performed at [facility]    Report Status PENDING  Incomplete  Urine culture     Status: None   Collection Time: 07/02/14  4:30 PM  Result Value Ref Range Status   Specimen Description URINE, RANDOM  Final   Special Requests Normal  Final   Colony Count NO GROWTH Performed at [facility]   Final   Culture NO GROWTH Performed at [facility]   Final   Report Status 07/03/2014 FINAL  Final  Culture, blood (routine x 2)     Status: None   Collection Time: 07/02/14  6:30 PM  Result Value Ref Range Status   Specimen Description BLOOD RIGHT HAND  Final   Special Requests BOTTLES DRAWN AEROBIC AND ANAEROBIC 10CC  Final   Culture   Final    KLEBSIELLA PNEUMONIAE Note: Confirmatory test for carbapenemase production is positive. COLISTIN 0.094ug/mL ETEST results for this drug are "FOR INVESTIGATIONAL USE ONLY" and should NOT be used for clinical purposes. CRITICAL RESULT CALLED TO, READ BACK BY AND VERIFIED WITH:  LATOYA HOWELL @ 0845 ON [id number] BY [facility] Note: Gram Stain Report Called to,Read Back By and Verified With: NADINE W@2 :33PM ON 07/03/14 BY DANTS Performed at [facility]    Report Status 07/07/2014 FINAL  Final   Organism ID, Bacteria KLEBSIELLA PNEUMONIAE  Final      Susceptibility   Klebsiella pneumoniae - MIC*    AMPICILLIN >=32 RESISTANT Resistant     AMPICILLIN/SULBACTAM >=32 RESISTANT Resistant     CEFAZOLIN >=64 RESISTANT Resistant     CEFEPIME RESISTANT      CEFTAZIDIME >=64 RESISTANT Resistant     CEFTRIAXONE >=64 RESISTANT Resistant     CIPROFLOXACIN >=4 RESISTANT Resistant     GENTAMICIN 8 INTERMEDIATE Intermediate     IMIPENEM >=16 RESISTANT  Resistant     PIP/TAZO >=128 RESISTANT Resistant     TOBRAMYCIN >=16 RESISTANT Resistant     TRIMETH/SULFA >=320 RESISTANT Resistant     * KLEBSIELLA PNEUMONIAE    Studies/Results: Dg Chest Port 1 View  07/05/2014   CLINICAL DATA:  Respiratory failure.  EXAM: PORTABLE CHEST - 1 VIEW  COMPARISON:  Single view of the chest 07/03/2014 and 06/30/2014.  FINDINGS: Endotracheal tube and left PICC remain in place. The patient's PICC has with been withdrawn with the tip now projecting at the superior cavoatrial junction. There is volume loss in left chest with airspace disease in left mid and lower lung zones, unchanged. Mild atelectasis in the right base  is noted. No pneumothorax identified. There is cardiomegaly.  IMPRESSION: Right PICC now projects in good position.  No change in airspace disease in the left mid and lower lung zones. No new abnormality.   Electronically Signed   By: Drusilla Kannerhomas  Dalessio M.D.   On: 07/05/2014 16:37      Assessment/Plan:  Active Problems:   UTI (lower urinary tract infection)   Respiratory failure   Sinus tachycardia   Pneumonia due to Serratia marcescens   Quadriplegia   Enterococcal bacteremia   Acute respiratory failure   Atelectasis of left lung   Chronic respiratory failure   Hypokalemia   Pneumonia   Carbapenem-resistant Klebsiella pneumoniae infection   Gram-negative bacteremia   Severe sepsis   Clostridium difficile colitis    Kathryn Booth is a 44 y.o. 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 Since then she has developed Clostridium difficile colitis and been treated for that but now   more fevers and is now been found to have CRE Klebsiella PNA from one blood culture drwawn at 1830 on January 3rd, prior blood culture from 07/02/14 at 1630 has not grown an organism yet per micro lab  #1 CRE bacteremia: only growing in 1/2 blood cultures from day of bacteremia but cultures were sequential first negative, later one positive rather than simultaneous. Per micro MIC is 1ug/uL S to AVYCAZ therefore should respond to drug.   And also the fact that the patient still had a PICC line. I feel that if we are going to see us as a genuine infection we should remove her PICC line.  I've asked her nurse to see if we can get peripheral access and then to discontinue the PICC line and culture the catheter tip  We will the meantime continue AVYCAZ,   [facility] called me because they've been contacted to see if the Concerta up home anti-biotics with this drug. Unfortunately there are several obstacles. #1 the drug is not even purchased a bottle without a prior authorization and #2 they cannot administer IV antibiotics or infusion services to individuals who have a private nurse at home. Patient hours is very competent located also on a ventilator at home. I don't see an easy route to give her this antibiotic at home at least as of yet. He may in 4 to have to stay in the hospital to complete a course of antibiotics will defer to case management I'm hoping that the [facility] will be active and effective (or that the organism was a contaminant) and that we do not have to reach for colistin As it can be notoriously hazardous in paralyzed patients with high risk for nephrotoxicity.  PROGNOSIS Is poor  I spent greater than 40 minutes with the patient including greater than 50% of time in face to face counsel of the patient and in coordination of their care.   #2 C. difficile colitis continue oral vancomycin, and would extend this during her period of extremely broad-spectrum antibiotics  #3 staph  aureus cervical infection on Chronic dicloxacillin. Could consider changing her to doxycycline if the original organism was sensitive to it with a lower risk for C.  difficile colitis than the dicloxacillin potentially and also with avoidance of double beta-lactam therapy that she has been having at present hospitalization  #4 Enterococcus bacteremia: sp 2 weeks therapy (and this was truly in only 1/2 simultaneous cultures  Dr. Orvan Falconer available on the weekend for questions.      LOS: 18 days   Acey Lav 07/07/2014, 3:25 PM

## 2014-07-08 DIAGNOSIS — R509 Fever, unspecified: Secondary | ICD-10-CM

## 2014-07-08 DIAGNOSIS — J156 Pneumonia due to other aerobic Gram-negative bacteria: Secondary | ICD-10-CM

## 2014-07-08 DIAGNOSIS — Y95 Nosocomial condition: Secondary | ICD-10-CM

## 2014-07-08 LAB — CULTURE, BLOOD (ROUTINE X 2): CULTURE: NO GROWTH

## 2014-07-08 LAB — GLUCOSE, CAPILLARY
GLUCOSE-CAPILLARY: 137 mg/dL — AB (ref 70–99)
GLUCOSE-CAPILLARY: 122 mg/dL — AB (ref 70–99)
Glucose-Capillary: 120 mg/dL — ABNORMAL HIGH (ref 70–99)

## 2014-07-08 MED ORDER — WHITE PETROLATUM GEL
Status: AC
  Administered 2014-07-08: 0.2
  Filled 2014-07-08: qty 1

## 2014-07-08 NOTE — Progress Notes (Signed)
07/08/2014  Around 0910 husband refuse for RN and nurse Tech to turn patient and wanted the patient to be left alone for 2 hours.  It was explain to husband, patient need to be turn to prevent pressure ulcer. Husband reply he wanted her to get some rest and that he is aware of pressure ulcer. Charge nurse and Facilities managernurse manager was made aware. RN and tech return to room and patient was turn and change. [facility] Tovia Kisner RN.

## 2014-07-08 NOTE — Progress Notes (Signed)
Patient ID: Kathryn Booth, female   DOB: June 16, 1971, 44 y.o.   MRN: [id number]         [facility]    Date of Admission:  06/19/2014           Month 3 therapy for MSSA cervical spine infection        Day 11 C. difficile colitis therapy        Day 3 ceftazidime avibactam  Active Problems:   UTI (lower urinary tract infection)   Respiratory failure   Sinus tachycardia   Pneumonia due to Serratia marcescens   Quadriplegia   Enterococcal bacteremia   Acute respiratory failure   Atelectasis of left lung   Chronic respiratory failure   Hypokalemia   Pneumonia   Carbapenem-resistant Klebsiella pneumoniae infection   Gram-negative bacteremia   Severe sepsis   Clostridium difficile colitis   . antiseptic oral rinse  7 mL Mouth Rinse QID  . ceftazidime avibactam (AVYCAZ) IVPB  2.5 g Intravenous 3 times per day  . chlorhexidine  15 mL Mouth Rinse BID  . dicloxacillin  500 mg Per Tube 3 times per day  . famotidine  20 mg Per Tube Daily  . feeding supplement (PRO-STAT SUGAR FREE 64)  30 mL Per Tube Q1500  . fentaNYL  25 mcg Transdermal Q72H  . heparin  5,000 Units Subcutaneous 3 times per day  . metoprolol tartrate  50 mg Per Tube BID  . midodrine  5 mg Per Tube TID WC  . sodium chloride  10-40 mL Intracatheter Q12H  . vancomycin  500 mg Per Tube QID     Past Medical History  Diagnosis Date  . Osteomyelitis   . Epidural abscess   . Respiratory failure   . Metabolic encephalopathy   . Nonischemic cardiomyopathy   . Hypotension   . Osteomyelitis of vertebra of cervical region   . Bipolar 1 disorder   . Anxiety disorder   . Hypoxemia   . Bacteremia   . Hypoxic ischemic encephalopathy (HIE)   . Cutaneous abscess of back excluding buttocks   . Quadriplegia   . Dysphagia, oropharyngeal phase   . Tracheostomy in place     History  Substance Use Topics  . Smoking status: Unknown If Ever Smoked  . Smokeless tobacco: Not on file  . Alcohol  Use: No    No family history on file. Allergies  Allergen Reactions  . Tape Rash    OBJECTIVE: Blood pressure 123/68, pulse 87, temperature 96.5 F (35.8 C), temperature source Axillary, resp. rate 26, height 5' (1.524 m), weight 142 lb 3.2 oz (64.5 kg), SpO2 100 %. General: She is on a ventilator Skin: No rash. Left arm PICC site appears normal Lungs: Clear anteriorly. She requires some tracheal suctioning periodically Cor: Regular S1 and S2 with a 1/6 systolic murmur Abdomen: Soft and nontender. Her nurse reports that her stools are not as liquid   Lab Results Lab Results  Component Value Date   WBC 10.3 07/04/2014   HGB 9.7* 07/04/2014   HCT 30.8* 07/04/2014   MCV 95.7 07/04/2014   PLT 263 07/04/2014    Lab Results  Component Value Date   CREATININE <0.30* 07/04/2014   BUN 7 07/04/2014   NA 133* 07/04/2014   K 4.6 07/04/2014   CL 96 07/04/2014   CO2 27 07/04/2014    Lab Results  Component Value Date   ALT 66* 06/21/2014   AST 34 06/21/2014   ALKPHOS  62 06/21/2014   BILITOT 0.6 06/21/2014     Microbiology: Recent Results (from the past 240 hour(s))  Culture, respiratory (NON-Expectorated)     Status: None   Collection Time: 07/02/14 12:23 PM  Result Value Ref Range Status   Specimen Description TRACHEAL ASPIRATE  Final   Special Requests NONE  Final   Gram Stain   Final    FEW WBC PRESENT, PREDOMINANTLY PMN NO SQUAMOUS EPITHELIAL CELLS SEEN FEW GRAM NEGATIVE RODS Performed at [facility]    Culture   Final    ABUNDANT SERRATIA MARCESCENS Performed at [facility]    Report Status 07/05/2014 FINAL  Final   Organism ID, Bacteria SERRATIA MARCESCENS  Final      Susceptibility   Serratia marcescens - MIC*    CEFAZOLIN >=64 RESISTANT Resistant     CEFEPIME <=1 SENSITIVE Sensitive     CEFTAZIDIME <=1 SENSITIVE Sensitive     CEFTRIAXONE <=1 SENSITIVE Sensitive     CIPROFLOXACIN <=0.25 SENSITIVE Sensitive     GENTAMICIN <=1 SENSITIVE  Sensitive     TOBRAMYCIN <=1 SENSITIVE Sensitive     TRIMETH/SULFA <=20 SENSITIVE Sensitive     * ABUNDANT SERRATIA MARCESCENS  Culture, blood (routine x 2)     Status: None (Preliminary result)   Collection Time: 07/02/14  2:48 PM  Result Value Ref Range Status   Specimen Description BLOOD LEFT HAND  Final   Special Requests BOTTLES DRAWN AEROBIC ONLY 2CC  Final   Culture   Final           BLOOD CULTURE RECEIVED NO GROWTH TO DATE CULTURE WILL BE HELD FOR 5 DAYS BEFORE ISSUING A FINAL NEGATIVE REPORT Note: Culture results may be compromised due to an inadequate volume of blood received in culture bottles. Performed at [facility]    Report Status PENDING  Incomplete  Urine culture     Status: None   Collection Time: 07/02/14  4:30 PM  Result Value Ref Range Status   Specimen Description URINE, RANDOM  Final   Special Requests Normal  Final   Colony Count NO GROWTH Performed at [facility]   Final   Culture NO GROWTH Performed at [facility]   Final   Report Status 07/03/2014 FINAL  Final  Culture, blood (routine x 2)     Status: None   Collection Time: 07/02/14  6:30 PM  Result Value Ref Range Status   Specimen Description BLOOD RIGHT HAND  Final   Special Requests BOTTLES DRAWN AEROBIC AND ANAEROBIC 10CC  Final   Culture   Final    KLEBSIELLA PNEUMONIAE Note: Confirmatory test for carbapenemase production is positive. COLISTIN 0.094ug/mL ETEST results for this drug are "FOR INVESTIGATIONAL USE ONLY" and should NOT be used for clinical purposes. CRITICAL RESULT CALLED TO, READ BACK BY AND VERIFIED WITH:  LATOYA HOWELL @ 0845 ON [id number] BY [facility] Note: Gram Stain Report Called to,Read Back By and Verified With: NADINE W@2 :33PM ON 07/03/14 BY DANTS Performed at [facility]    Report Status 07/07/2014 FINAL  Final   Organism ID, Bacteria KLEBSIELLA PNEUMONIAE  Final      Susceptibility   Klebsiella pneumoniae - MIC*    AMPICILLIN >=32 RESISTANT  Resistant     AMPICILLIN/SULBACTAM >=32 RESISTANT Resistant     CEFAZOLIN >=64 RESISTANT Resistant     CEFEPIME RESISTANT      CEFTAZIDIME >=64 RESISTANT Resistant     CEFTRIAXONE >=64 RESISTANT Resistant  CIPROFLOXACIN >=4 RESISTANT Resistant     GENTAMICIN 8 INTERMEDIATE Intermediate     IMIPENEM >=16 RESISTANT Resistant     PIP/TAZO >=128 RESISTANT Resistant     TOBRAMYCIN >=16 RESISTANT Resistant     TRIMETH/SULFA >=320 RESISTANT Resistant     * KLEBSIELLA PNEUMONIAE    Assessment: She has multiple potential sources for recurrent fever. Recently she has been treated for MSSA cervical spine infection, enterococcal bacteremia, Serratia HCAP, C. difficile colitis and most recently bacteremia due to multidrug-resistant Klebsiella. We hope to get her PICC line out since it was placed during her latest round of bacteremia but no peripheral IV access is available. Although she is febrile she is clinically stable and I prefer to wait and see the results of her most recent blood cultures before considering placement of a new central line.  Plan: 1. Continue current antibiotics 2. Await results of repeat blood cultures  Cliffton AstersJohn Shaelynn Dragos, MD [facility] [phone] pager   [phone] cell 07/08/2014, 1:35 PM

## 2014-07-08 NOTE — Progress Notes (Signed)
PULMONARY / CRITICAL CARE MEDICINE   Name: Kathryn Booth MRN: [id number] DOB: 05-24-1971    ADMISSION DATE:  06/19/2014 CONSULTATION DATE:  07/08/2014  REFERRING MD :  [facility]  CHIEF COMPLAINT:  Tachycardia  INITIAL PRESENTATION:  44 y.o. 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  Recently admitted to [facility] (06/07/14).  Has had persistent tachycardia while at [facility].  Brought to [facility] ED 12/21 after husbands request for further evaluation of tachycardia.  In ED, UA suggestive of UTI.  STUDIES/SIGNIFICANT EVENTS: 10/12  Debridement of cervical abscess [facility]) 12/09  Admitted to [facility] 12/21  Admitted to [facility] with hx as above 12/28 Husband definitely wants to take patient home. Care manager says things are being set up. Temp now 102.13F -- ongoing fevers 101-103F. Controlled wit tylenol.  12/28 CT chest: Dense left lower lobe collapse/ consolidation, suggestive of pneumonia. There is patchy less confluent airspace disease in the left upper lobe. 01/06 Labile fevers 01/07 Blood culture from 01/03 positive for ESBL and carbapenem resistant Klebsiella. ID consult. AVYCAZ initiated (ceftaz-avibactam)  INDWELLING DEVICES:: Chronic trach  RUE PICC (from [facility])  >> 1/03 LUE PICC 1/03 >>   MICRO DATA: Urine 12/21 >> 30k multiple organisms Resp 12/22 >> Serratia (pansens) Blood 12/22 >> 1/2 enterococcus C diff 12/29 >> POSITIVE Blood 01/03 >> 1/2 ESBL, carbapenem-resistent Klebsiella   CURRENT ANTIMICROBIALS:  Dicloxacillin (for chronic MSSA osteo) >> 08/14/14 (planned) Vanc 12/24 >> 01/05 Cipro 12/21 >> 12/26, 01/03 >> 01/07 Enteral vanc 12/28 >>  Ceftaz 01/04 >> 01/07 Ceftaz-avibactam (AVYCAZ) 01/07 >>    SUBJECTIVE/OVERNIGHT/INTERVAL HX Female companion in room says pt is tired and asks we not try to disturb her now. Her eyes are open, not tracking.   VITAL SIGNS: Temp:  [96.5 F (35.8 C)-103.2 F (39.6 C)] 96.5 F (35.8 C) (01/09 0825) Pulse  Rate:  [82-124] 87 (01/09 0824) Resp:  [14-30] 24 (01/09 0824) BP: (96-147)/(46-95) 123/68 mmHg (01/09 0824) SpO2:  [97 %-100 %] 100 % (01/09 0824) FiO2 (%):  [30 %] 30 % (01/09 0805) Weight:  [64.5 kg (142 lb 3.2 oz)] 64.5 kg (142 lb 3.2 oz) (01/09 0555)   VENTILATOR SETTINGS: Vent Mode:  [-] SIMV/PC/PS FiO2 (%):  [30 %] 30 % Set Rate:  [15 bmp] 15 bmp Vt Set:  [450 mL] 450 mL PEEP:  [5 cmH20] 5 cmH20 Pressure Support:  [10 cmH20] 10 cmH20 Plateau Pressure:  [17 cmH20] 17 cmH20   INTAKE / OUTPUT: Intake/Output      01/08 0701 - 01/09 0700 01/09 0701 - 01/10 0700   I.V. (mL/kg) 250 (3.9)    NG/GT 110    IV Piggyback 100    Total Intake(mL/kg) 460 (7.1)    Urine (mL/kg/hr) 1225 (0.8)    Total Output 1225     Net -765            PHYSICAL EXAMINATION: General: NAD Neuro: RASS 0. Does not F/C, blinks to confrontation HEENT: WNL Cardiovascular: hyperdynamic precordium, reg, 3/6 systolic M Lungs: clear anteriorly, unlabored on vent Abdomen: soft, non tender, PEG site clean Ext: no edema, no cyanosis   LABS: I have reviewed all of today's lab results. Relevant abnormalities are discussed in the A/P section  CXR: NNF  ASSESSMENT / PLAN:  PULMONARY A: Chronic VDRF Chronic tracheostomy status HCAP P:   Cont home vent Cont vent bundle  CARDIOVASCULAR  A:  Sinus tachycardia, controlled  87/ min on 1/9 Chronic hypotension, controlled on midodrine P:  Increase metoprolol 01/08 Decrase midodrine 01/08  INFECTIOUS A:   Chronic cervical osteomyelitis/diskitis - on dicloxacillin through 08/14/14. Serratia HCAP Enterococcal bacteremia, treated C diff colitis Klebsiella bacteremia - highly resistant (ESBL and carbapenemase producing) Labile fevers - improving P:   Micro and abx per ID Cont antipyretics and cooling blanket as needed  Anticipate DC to home early part of next week if we can arrange for Avycaz @ home  CD Young, MD PCCM m [phone]   p [phone]   After 3:00 PM call [phone]

## 2014-07-09 ENCOUNTER — Inpatient Hospital Stay (HOSPITAL_COMMUNITY): Payer: Medicare Other

## 2014-07-09 DIAGNOSIS — J9601 Acute respiratory failure with hypoxia: Secondary | ICD-10-CM

## 2014-07-09 DIAGNOSIS — R509 Fever, unspecified: Secondary | ICD-10-CM | POA: Insufficient documentation

## 2014-07-09 LAB — GLUCOSE, CAPILLARY
GLUCOSE-CAPILLARY: 142 mg/dL — AB (ref 70–99)
Glucose-Capillary: 111 mg/dL — ABNORMAL HIGH (ref 70–99)

## 2014-07-09 LAB — BASIC METABOLIC PANEL
Anion gap: 10 (ref 5–15)
BUN: 11 mg/dL (ref 6–23)
CO2: 27 mmol/L (ref 19–32)
Calcium: 9.6 mg/dL (ref 8.4–10.5)
Chloride: 105 mEq/L (ref 96–112)
Creatinine, Ser: <0.3 mg/dL — ABNORMAL LOW (ref 0.50–1.10)
Glucose, Bld: 177 mg/dL — ABNORMAL HIGH (ref 70–99)
Potassium: 2.9 mmol/L — ABNORMAL LOW (ref 3.5–5.1)
Sodium: 142 mmol/L (ref 135–145)

## 2014-07-09 LAB — CBC WITH DIFFERENTIAL/PLATELET
BASOS PCT: 0 % (ref 0–1)
Basophils Absolute: 0 10*3/uL (ref 0.0–0.1)
Eosinophils Absolute: 0.3 10*3/uL (ref 0.0–0.7)
Eosinophils Relative: 3 % (ref 0–5)
HEMATOCRIT: 31.3 % — AB (ref 36.0–46.0)
Hemoglobin: 9.8 g/dL — ABNORMAL LOW (ref 12.0–15.0)
LYMPHS PCT: 15 % (ref 12–46)
Lymphs Abs: 1.6 10*3/uL (ref 0.7–4.0)
MCH: 29.3 pg (ref 26.0–34.0)
MCHC: 31.3 g/dL (ref 30.0–36.0)
MCV: 93.4 fL (ref 78.0–100.0)
MONO ABS: 0.9 10*3/uL (ref 0.1–1.0)
Monocytes Relative: 8 % (ref 3–12)
Neutro Abs: 8.1 10*3/uL — ABNORMAL HIGH (ref 1.7–7.7)
Neutrophils Relative %: 74 % (ref 43–77)
Platelets: 288 10*3/uL (ref 150–400)
RBC: 3.35 MIL/uL — ABNORMAL LOW (ref 3.87–5.11)
RDW: 14.1 % (ref 11.5–15.5)
WBC: 10.8 10*3/uL — AB (ref 4.0–10.5)

## 2014-07-09 MED ORDER — POTASSIUM CHLORIDE 20 MEQ/15ML (10%) PO SOLN
20.0000 meq | ORAL | Status: AC
  Administered 2014-07-09 (×2): 20 meq via ORAL
  Filled 2014-07-09 (×2): qty 15

## 2014-07-09 NOTE — Progress Notes (Signed)
Patient ID: Kathryn Booth, female   DOB: [birth year], 44 y.o.   MRN: [id number]         [facility]    Date of Admission:  06/19/2014           Month 3 therapy for MSSA cervical spine infection        Day 12 C. difficile colitis therapy        Day 4 ceftazidime avibactam  Active Problems:   UTI (lower urinary tract infection)   Respiratory failure   Sinus tachycardia   Pneumonia due to Serratia marcescens   Quadriplegia   Enterococcal bacteremia   Acute respiratory failure   Atelectasis of left lung   Chronic respiratory failure   Hypokalemia   Pneumonia   Carbapenem-resistant Klebsiella pneumoniae infection   Gram-negative bacteremia   Severe sepsis   Clostridium difficile colitis   . antiseptic oral rinse  7 mL Mouth Rinse QID  . ceftazidime avibactam (AVYCAZ) IVPB  2.5 g Intravenous 3 times per day  . chlorhexidine  15 mL Mouth Rinse BID  . dicloxacillin  500 mg Per Tube 3 times per day  . famotidine  20 mg Per Tube Daily  . feeding supplement (PRO-STAT SUGAR FREE 64)  30 mL Per Tube Q1500  . fentaNYL  25 mcg Transdermal Q72H  . heparin  5,000 Units Subcutaneous 3 times per day  . metoprolol tartrate  50 mg Per Tube BID  . midodrine  5 mg Per Tube TID WC  . sodium chloride  10-40 mL Intracatheter Q12H  . vancomycin  500 mg Per Tube QID     Past Medical History  Diagnosis Date  . Osteomyelitis   . Epidural abscess   . Respiratory failure   . Metabolic encephalopathy   . Nonischemic cardiomyopathy   . Hypotension   . Osteomyelitis of vertebra of cervical region   . Bipolar 1 disorder   . Anxiety disorder   . Hypoxemia   . Bacteremia   . Hypoxic ischemic encephalopathy (HIE)   . Cutaneous abscess of back excluding buttocks   . Quadriplegia   . Dysphagia, oropharyngeal phase   . Tracheostomy in place     History  Substance Use Topics  . Smoking status: Unknown If Ever Smoked  . Smokeless tobacco: Not on file  . Alcohol  Use: No    No family history on file. Allergies  Allergen Reactions  . Tape Rash    OBJECTIVE: Blood pressure 106/60, pulse 83, temperature 98.6 F (37 C), temperature source Rectal, resp. rate 23, height 5' (1.524 m), weight 154 lb 15.7 oz (70.3 kg), SpO2 100 %.   General: She is on a ventilator Skin: Left arm PICC site appears normal Lungs: Clear anteriorly.  Cor: Regular S1 and S2 with a 1/6 systolic murmur Abdomen: Soft and nontender. Her nurse reports that her diarrhea is improving   Lab Results Lab Results  Component Value Date   WBC 10.3 07/04/2014   HGB 9.7* 07/04/2014   HCT 30.8* 07/04/2014   MCV 95.7 07/04/2014   PLT 263 07/04/2014    Lab Results  Component Value Date   CREATININE <0.30* 07/04/2014   BUN 7 07/04/2014   NA 133* 07/04/2014   K 4.6 07/04/2014   CL 96 07/04/2014   CO2 27 07/04/2014    Lab Results  Component Value Date   ALT 66* 06/21/2014   AST 34 06/21/2014   ALKPHOS 62 06/21/2014   BILITOT 0.6 06/21/2014  Microbiology: Recent Results (from the past 240 hour(s))  Culture, respiratory (NON-Expectorated)     Status: None   Collection Time: 07/02/14 12:23 PM  Result Value Ref Range Status   Specimen Description TRACHEAL ASPIRATE  Final   Special Requests NONE  Final   Gram Stain   Final    FEW WBC PRESENT, PREDOMINANTLY PMN NO SQUAMOUS EPITHELIAL CELLS SEEN FEW GRAM NEGATIVE RODS [facility]formed at [facility]    Culture   Final    ABUNDANT SERRATIA MARCESCENS Performed at [facility]    Report Status 07/05/2014 FINAL  Final   Organism ID, Bacteria SERRATIA MARCESCENS  Final      Susceptibility   Serratia marcescens - MIC*    CEFAZOLIN >=64 RESISTANT Resistant     CEFEPIME <=1 SENSITIVE Sensitive     CEFTAZIDIME <=1 SENSITIVE Sensitive     CEFTRIAXONE <=1 SENSITIVE Sensitive     CIPROFLOXACIN <=0.25 SENSITIVE Sensitive     GENTAMICIN <=1 SENSITIVE Sensitive     TOBRAMYCIN <=1 SENSITIVE Sensitive      TRIMETH/SULFA <=20 SENSITIVE Sensitive     * ABUNDANT SERRATIA MARCESCENS  Culture, blood (routine x 2)     Status: None   Collection Time: 07/02/14  2:48 PM  Result Value Ref Range Status   Specimen Description BLOOD LEFT HAND  Final   Special Requests BOTTLES DRAWN AEROBIC ONLY 2CC  Final   Culture   Final    NO GROWTH 5 DAYS Note: Culture results may be compromised due to an inadequate volume of blood received in culture bottles. Performed at [facility]    Report Status 07/08/2014 FINAL  Final  Urine culture     Status: None   Collection Time: 07/02/14  4:30 PM  Result Value Ref Range Status   Specimen Description URINE, RANDOM  Final   Special Requests Normal  Final   Colony Count NO GROWTH Performed at [facility]   Final   Culture NO GROWTH Performed at [facility]   Final   Report Status 07/03/2014 FINAL  Final  Culture, blood (routine x 2)     Status: None   Collection Time: 07/02/14  6:30 PM  Result Value Ref Range Status   Specimen Description BLOOD RIGHT HAND  Final   Special Requests BOTTLES DRAWN AEROBIC AND ANAEROBIC 10CC  Final   Culture   Final    KLEBSIELLA PNEUMONIAE Note: Confirmatory test for carbapenemase production is positive. COLISTIN 0.094ug/mL ETEST results for this drug are "FOR INVESTIGATIONAL USE ONLY" and should NOT be used for clinical purposes. CRITICAL RESULT CALLED TO, READ BACK BY AND VERIFIED WITH:  LATOYA HOWELL @ 0845 ON [id number] BY [facility] Note: Gram Stain Report Called to,Read Back By and Verified With: NADINE W@2 :33PM ON 07/03/14 BY DANTS Performed at [facility]    Report Status 07/07/2014 FINAL  Final   Organism ID, Bacteria KLEBSIELLA PNEUMONIAE  Final      Susceptibility   Klebsiella pneumoniae - MIC*    AMPICILLIN >=32 RESISTANT Resistant     AMPICILLIN/SULBACTAM >=32 RESISTANT Resistant     CEFAZOLIN >=64 RESISTANT Resistant     CEFEPIME RESISTANT      CEFTAZIDIME >=64 RESISTANT Resistant      CEFTRIAXONE >=64 RESISTANT Resistant     CIPROFLOXACIN >=4 RESISTANT Resistant     GENTAMICIN 8 INTERMEDIATE Intermediate     IMIPENEM >=16 RESISTANT Resistant     PIP/TAZO >=128 RESISTANT Resistant     TOBRAMYCIN >=16  RESISTANT Resistant     TRIMETH/SULFA >=320 RESISTANT Resistant     * KLEBSIELLA PNEUMONIAE    Assessment: Her recurrent high fever seems to be improving. Repeat blood cultures from 07/07/2014 are negative to date. If they remain negative tomorrow then she can have her PICC removed and have a new PICC or central line placed.  Plan: 1. Continue current antibiotics 2. Await results of repeat blood cultures  Cliffton Asters, MD [facility] [phone] pager   [phone] cell 07/09/2014, 11:39 AM

## 2014-07-09 NOTE — Progress Notes (Signed)
ANTIBIOTIC CONSULT NOTE - FOLLOW UP  Pharmacy Consult for Ceftaz/Avibactam Pricilla Holm(Avycaz) Indication: Bacteremia  Allergies  Allergen Reactions  . Tape Rash    Patient Measurements: Height: 5' (152.4 cm) Weight: 154 lb 15.7 oz (70.3 kg) IBW/kg (Calculated) : 45.5   Assessment: 43 YOF with complicated infectious history and is on dicloxacillin from PTA for history of cervical hardware/osteomyelitis. Quadriplegic- SCr falsely low. Continuing on avycaz for serratia HCAP/kleb bacteremia. Afebrile, wbc 10.8. To remove PICC tomorrow and have new PICC or CL placed per ID  Cipro 12/22 >>12/27; 1/3 >>1/7 Cefepime 12/23 >>12/24 Dicloxacillin PTA 12/15 >> (08/14/14) Fluc 12/22 >> 12/23 Vanc 12/24 >> 1/5  12/27: VT 7.3 on 750mg  q12  12/28: VT 14.7 on 1g q8    1/4: VT 18 on 1g q8 Vanc po 12/29>>(07/11/14) Elita QuickFortaz 1/4 >>1/7 Avycaz 1/7 >>  1/8 BCx2 >> ngtd 1/3 Blood >> 1/2 Klebsiella (+carbapenemase production). (Inter- Natasha BenceGent, otherwise R)- Sensitive to [facility] testing. 1/3 Resp >> abundant Serratia marcescens (pan S except R to Ancef) 12/28 cdiff pos 12/22 TA cx - Serratia Marcescens (pan S except R to Ancef) 12/22 BCx x2 - 1/2 Enterococcus 12/21 UCx - neg  Plan: -Avycaz (ceftaz-avibactam) 2.5g IV q8h- in stock, ordering enough to get through weekend -PO vanc 500mg  QID for CDiff x 14 days (maybe extend per ID w/ very BS abx) -F/u clinical progress, c/s, renal function  Babs BertinHaley Nirvaan Frett, PharmD Clinical Pharmacist - Resident Pager [phone] 07/09/2014 3:58 PM

## 2014-07-09 NOTE — Progress Notes (Signed)
PULMONARY / CRITICAL CARE MEDICINE   Name: Kathryn Booth MRN: [id number] DOB: [birth year]    ADMISSION DATE:  06/19/2014 CONSULTATION DATE:  07/09/2014  REFERRING MD :  [facility]  CHIEF COMPLAINT:  Tachycardia  INITIAL PRESENTATION:  44 y.o. 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  Recently admitted to [facility] (06/07/14).  Has had persistent tachycardia while at [facility].  Brought to [facility] ED 12/21 after husbands request for further evaluation of tachycardia.  In ED, UA suggestive of UTI.  STUDIES/SIGNIFICANT EVENTS: 10/12  Debridement of cervical abscess [facility]) 12/09  Admitted to [facility] 12/21  Admitted to [facility] with hx as above 12/28 Husband definitely wants to take patient home. Care manager says things are being set up. Temp now 102.44F -- ongoing fevers 101-103F. Controlled wit tylenol.  12/28 CT chest: Dense left lower lobe collapse/ consolidation, suggestive of pneumonia. There is patchy less confluent airspace disease in the left upper lobe. 01/06 Labile fevers 01/07 Blood culture from 01/03 positive for ESBL and carbapenem resistant Klebsiella. ID consult. AVYCAZ initiated (ceftaz-avibactam)  INDWELLING DEVICES:: Chronic trach  RUE PICC (from [facility])  >> 1/03 LUE PICC 1/03 >>   MICRO DATA: Urine 12/21 >> 30k multiple organisms Resp 12/22 >> Serratia (pansens) Blood 12/22 >> 1/2 enterococcus C diff 12/29 >> POSITIVE Blood 01/03 >> 1/2 ESBL, carbapenem-resistent Klebsiella   CURRENT ANTIMICROBIALS:  Dicloxacillin (for chronic MSSA osteo) >> 08/14/14 (planned) Vanc 12/24 >> 01/05 Cipro 12/21 >> 12/26, 01/03 >> 01/07 Enteral vanc 12/28 >>  Ceftaz 01/04 >> 01/07 Ceftaz-avibactam (AVYCAZ) 01/07 >>    SUBJECTIVE/OVERNIGHT/INTERVAL HX Husband not here. Nurse reports no acute problems  VITAL SIGNS: Temp:  [98.6 F (37 C)-100.6 F (38.1 C)] 98.6 F (37 C) (01/10 0422) Pulse Rate:  [76-102] 83 (01/10 0809) Resp:  [16-28] 23 (01/10 0809) BP:  (106-119)/(60-70) 106/60 mmHg (01/10 0422) SpO2:  [100 %] 100 % (01/10 0809) FiO2 (%):  [30 %] 30 % (01/10 0809) Weight:  [70.3 kg (154 lb 15.7 oz)] 70.3 kg (154 lb 15.7 oz) (01/10 0422)   VENTILATOR SETTINGS: Vent Mode:  [-] SIMV;PCV FiO2 (%):  [30 %] 30 % Set Rate:  [2 bmp-16 bmp] 15 bmp Vt Set:  [450 mL] 450 mL PEEP:  [5 cmH20] 5 cmH20 Pressure Support:  [10 cmH20-15 cmH20] 15 cmH20   INTAKE / OUTPUT: Intake/Output      01/09 0701 - 01/10 0700 01/10 0701 - 01/11 0700   I.V. (mL/kg) 240 (3.4)    NG/GT 660    IV Piggyback 50    Total Intake(mL/kg) 950 (13.5)    Urine (mL/kg/hr) 1025 (0.6)    Total Output 1025     Net -75          Stool Occurrence 3 x      PHYSICAL EXAMINATION: General: NAD Neuro: RASS 0. , blinks on command HEENT: WNL Cardiovascular: hyperdynamic precordium, reg, 3/6 systolic M Lungs: clear anteriorly, unlabored on vent Abdomen: soft, non tender, PEG site clean Ext: no edema, no cyanosis   LABS: I have reviewed all of today's lab results. Relevant abnormalities are discussed in the A/P section  CXR: NNF  ASSESSMENT / PLAN:  PULMONARY A: Chronic VDRF Chronic tracheostomy status HCAP P:   Cont home vent Cont vent bundle  CARDIOVASCULAR  A:  Sinus tachycardia, controlled  87/ min on 1/9 Chronic hypotension, controlled on midodrine P:  Increase metoprolol 01/08 Decrase midodrine 01/08  INFECTIOUS A:   Pneumonia L Lung as of CXR  11/6 Chronic cervical osteomyelitis/diskitis - on dicloxacillin through 08/14/14. Serratia HCAP Enterococcal bacteremia, treated C diff colitis Klebsiella bacteremia - highly resistant (ESBL and carbapenemase producing) Labile fevers - improving Appreciate ID input- blood cultures pending P:   Micro and abx per ID Cont antipyretics and cooling blanket as needed CXR 1/10, CBC  Husband has wanted DC to home early part of next week if we can arrange for Avycaz @ home. Seems unlikely she can be cared for in  home environment.  CD Maple HudsonYoung, MD PCCM m [phone]   p [phone]  After 3:00 PM call [phone]

## 2014-07-09 NOTE — Progress Notes (Signed)
It is becoming increasingly difficult to provide patient care for Ms Andrey CampanileWilson due to the negative attitude of the  patient's husband. His behavior presents as threatening as evidenced by his demanding nursing care at 07:00/shift change. His response was very loud stating that nurses had a lot of attitude. His behavior presents as threatening when he demands pain medication for his wife, without allowing the RN to assess, use her nursing judgement and provide him with education on the pain management plan. His behavior presents as threatening when he tells the nurse he will phone her later to check if the pain medication was given. As Consulting civil engineercharge RN, I am 100 % confident that our RNs can provide good nursing care. However, this family member is too much for one nurse to manage and a larger circle of support is needed to insure that no nurse feels theatened .

## 2014-07-09 NOTE — Progress Notes (Signed)
Pt had some apnea  Placed back on full support

## 2014-07-10 DIAGNOSIS — L040 Acute lymphadenitis of face, head and neck: Secondary | ICD-10-CM

## 2014-07-10 DIAGNOSIS — J150 Pneumonia due to Klebsiella pneumoniae: Secondary | ICD-10-CM

## 2014-07-10 DIAGNOSIS — R5081 Fever presenting with conditions classified elsewhere: Secondary | ICD-10-CM

## 2014-07-10 LAB — GLUCOSE, CAPILLARY: Glucose-Capillary: 116 mg/dL — ABNORMAL HIGH (ref 70–99)

## 2014-07-10 MED ORDER — FENTANYL CITRATE 0.05 MG/ML IJ SOLN
50.0000 ug | INTRAMUSCULAR | Status: DC | PRN
  Administered 2014-07-10 – 2014-07-21 (×27): 50 ug via INTRAVENOUS
  Administered 2014-07-21: 09:00:00 via INTRAVENOUS
  Administered 2014-07-21 – 2014-07-25 (×9): 50 ug via INTRAVENOUS
  Filled 2014-07-10 (×37): qty 2

## 2014-07-10 MED ORDER — FENTANYL 50 MCG/HR TD PT72
75.0000 ug | MEDICATED_PATCH | TRANSDERMAL | Status: DC
  Administered 2014-07-12: 75 ug via TRANSDERMAL
  Filled 2014-07-10 (×2): qty 1

## 2014-07-10 MED ORDER — POTASSIUM CHLORIDE 20 MEQ/15ML (10%) PO SOLN
20.0000 meq | Freq: Once | ORAL | Status: AC
  Administered 2014-07-10: 20 meq
  Filled 2014-07-10: qty 15

## 2014-07-10 NOTE — Progress Notes (Signed)
[facility]    Subjective: Pt on ventilator trached, watching TV with relative   Antibiotics:  Anti-infectives    Start     Dose/Rate Route Frequency Ordered Stop   07/06/14 1400  ceftazidime-avibactam (AVYCAZ) 2.5 g in dextrose 5 % 50 mL IVPB     2.5 g25 mL/hr over 2 Hours Intravenous 3 times per day 07/06/14 0949     07/04/14 1800  vancomycin (VANCOCIN) 50 mg/mL oral solution 500 mg     500 mg Per Tube 4 times daily 07/04/14 1541 07/11/14 0959   07/03/14 1545  cefTAZidime (FORTAZ) 1 g in dextrose 5 % 50 mL IVPB  Status:  Discontinued     1 g100 mL/hr over 30 Minutes Intravenous Every 8 hours 07/03/14 1541 07/06/14 0949   07/02/14 1300  ciprofloxacin (CIPRO) IVPB 400 mg  Status:  Discontinued     400 mg200 mL/hr over 60 Minutes Intravenous Every 12 hours 07/02/14 1227 07/06/14 0949   06/28/14 1400  vancomycin (VANCOCIN) 50 mg/mL oral solution 500 mg  Status:  Discontinued     500 mg Oral 4 times daily 06/28/14 1251 07/04/14 1541   06/27/14 1015  vancomycin (VANCOCIN) 50 mg/mL oral solution 125 mg  Status:  Discontinued     125 mg Oral 4 times daily 06/27/14 1001 06/28/14 1251   06/25/14 1400  vancomycin (VANCOCIN) IVPB 1000 mg/200 mL premix  Status:  Discontinued     1,000 mg200 mL/hr over 60 Minutes Intravenous Every 8 hours 06/25/14 1318 07/04/14 1541   06/23/14 2200  ciprofloxacin (CIPRO) IVPB 400 mg     400 mg200 mL/hr over 60 Minutes Intravenous Every 12 hours 06/23/14 1530 06/24/14 2206   06/22/14 2300  vancomycin (VANCOCIN) IVPB 750 mg/150 ml premix  Status:  Discontinued     750 mg150 mL/hr over 60 Minutes Intravenous Every 12 hours 06/22/14 1134 06/25/14 1317   06/22/14 1130  vancomycin (VANCOCIN) IVPB 1000 mg/200 mL premix     1,000 mg200 mL/hr over 60 Minutes Intravenous  Once 06/22/14 1128 06/22/14 1315   06/21/14 1200  ceFEPIme (MAXIPIME) 1 g in dextrose 5 % 50 mL IVPB  Status:  Discontinued     1 g100 mL/hr over 30 Minutes Intravenous 3  times per day 06/21/14 1049 06/22/14 1012   06/21/14 1100  ceFEPIme (MAXIPIME) 1 g in dextrose 5 % 50 mL IVPB  Status:  Discontinued     1 g100 mL/hr over 30 Minutes Intravenous Every 12 hours 06/21/14 1047 06/21/14 1049   06/20/14 1000  ciprofloxacin (CIPRO) IVPB 400 mg  Status:  Discontinued     400 mg200 mL/hr over 60 Minutes Intravenous Every 12 hours 06/20/14 0919 06/23/14 1530   06/20/14 1000  fluconazole (DIFLUCAN) 40 MG/ML suspension 200 mg  Status:  Discontinued     200 mg Per Tube Daily 06/20/14 0934 06/21/14 1101   06/20/14 0600  dicloxacillin (DYNAPEN) capsule 500 mg     500 mg Per Tube 3 times per day 06/20/14 0219     06/20/14 0000  fluconazole (DIFLUCAN) IVPB 200 mg  Status:  Discontinued     200 mg100 mL/hr over 60 Minutes Intravenous Every 24 hours 06/19/14 2349 06/20/14 0934      Medications: Scheduled Meds: . antiseptic oral rinse  7 mL Mouth Rinse QID  . ceftazidime avibactam (AVYCAZ) IVPB  2.5 g Intravenous 3 times per day  . chlorhexidine  15 mL Mouth Rinse BID  . dicloxacillin  500  mg Per Tube 3 times per day  . famotidine  20 mg Per Tube Daily  . feeding supplement (PRO-STAT SUGAR FREE 64)  30 mL Per Tube Q1500  . fentaNYL  25 mcg Transdermal Q72H  . heparin  5,000 Units Subcutaneous 3 times per day  . metoprolol tartrate  50 mg Per Tube BID  . midodrine  5 mg Per Tube TID WC  . sodium chloride  10-40 mL Intracatheter Q12H  . vancomycin  500 mg Per Tube QID   Continuous Infusions: . sodium chloride 10 mL/hr at 07/09/14 0630  . feeding supplement (JEVITY 1.2 CAL) 1,000 mL (07/10/14 0441)   PRN Meds:.sodium chloride, acetaminophen (TYLENOL) oral liquid 160 mg/5 mL, metoprolol, sodium chloride    Objective: Weight change: 0 lb (0 kg)  Intake/Output Summary (Last 24 hours) at 07/10/14 1559 Last data filed at 07/10/14 0741  Gross per 24 hour  Intake      0 ml  Output   1425 ml  Net  -1425 ml   Blood pressure 110/62, pulse 94, temperature 101.2 F  (38.4 C), temperature source Rectal, resp. rate 22, height 5' (1.524 m), weight 154 lb 15.7 oz (70.3 kg), SpO2 100 %. Temp:  [97.4 F (36.3 C)-102.2 F (39 C)] 101.2 F (38.4 C) (01/11 1230) Pulse Rate:  [94-113] 94 (01/11 1230) Resp:  [15-24] 22 (01/11 1230) BP: (108-117)/(62-69) 110/62 mmHg (01/11 1230) SpO2:  [100 %] 100 % (01/11 1230) FiO2 (%):  [30 %] 30 % (01/11 1153) Weight:  [154 lb 15.7 oz (70.3 kg)] 154 lb 15.7 oz (70.3 kg) (01/11 0500)  Physical Exam: General: Alert and awake  HEENT: anicteric sclera,  EOMI CVS tachycardic rate, normal r,  no murmur rubs or gallops Chest: Coarse breath sounds throughout  Abdomen: soft mild diffuse Extremities: Contractures  Skin: PICC is clean  Neuro: Quadriplegic   CBC Latest Ref Rng 07/09/2014 07/04/2014 07/03/2014  WBC 4.0 - 10.5 K/uL 10.8(H) 10.3 10.6(H)  Hemoglobin 12.0 - 15.0 g/dL 9.6(E) 4.5(W) 0.9(W)  Hematocrit 36.0 - 46.0 % 31.3(L) 30.8(L) 26.6(L)  Platelets 150 - 400 K/uL 288 263 270      BMET  Recent Labs  07/09/14 1216  NA 142  K 2.9*  CL 105  CO2 27  GLUCOSE 177*  BUN 11  CREATININE <0.30*  CALCIUM 9.6     Liver Panel  No results for input(s): PROT, ALBUMIN, AST, ALT, ALKPHOS, BILITOT, BILIDIR, IBILI in the last 72 hours.     Sedimentation Rate No results for input(s): ESRSEDRATE in the last 72 hours. C-Reactive Protein No results for input(s): CRP in the last 72 hours.  Micro Results: Recent Results (from the past 720 hour(s))  Urine culture     Status: None   Collection Time: 06/19/14  8:13 PM  Result Value Ref Range Status   Specimen Description URINE, CATHETERIZED  Final   Special Requests NONE  Final   Culture  Setup Time   Final    06/20/2014 04:19 Performed at [facility] Count   Final    30,000 COLONIES/ML Performed at [facility]    Culture   Final    Multiple bacterial morphotypes present, none predominant. Suggest appropriate recollection if  clinically indicated. Performed at [facility]    Report Status 06/21/2014 FINAL  Final  MRSA PCR Screening     Status: None   Collection Time: 06/20/14 12:20 AM  Result Value Ref Range Status   MRSA  by PCR NEGATIVE NEGATIVE Final    Comment:        The GeneXpert MRSA Assay (FDA approved for NASAL specimens only), is one component of a comprehensive MRSA colonization surveillance program. It is not intended to diagnose MRSA infection nor to guide or monitor treatment for MRSA infections.   Culture, blood (routine x 2)     Status: None (Preliminary result)   Collection Time: 06/20/14  1:09 AM  Result Value Ref Range Status   Specimen Description BLOOD LEFT HAND  Final   Special Requests BOTTLES DRAWN AEROBIC AND ANAEROBIC 5CC EA  Final   Culture  Setup Time   Final    06/20/2014 04:50 Performed at [facility]    Culture   Final    ENTEROCOCCUS SPECIES Note: Gram Stain Report Called to,Read Back By and Verified With: Tory Emerald RN [id number] Performed at [facility]    Report Status PENDING  Incomplete  Culture, blood (routine x 2)     Status: None (Preliminary result)   Collection Time: 06/20/14  1:16 AM  Result Value Ref Range Status   Specimen Description BLOOD RIGHT HAND  Final   Special Requests BOTTLES DRAWN AEROBIC AND ANAEROBIC 5CC EA  Final   Culture  Setup Time   Final    06/20/2014 04:47 Performed at [facility]    Culture   Final           BLOOD CULTURE RECEIVED NO GROWTH TO DATE CULTURE WILL BE HELD FOR 5 DAYS BEFORE ISSUING A FINAL NEGATIVE REPORT Performed at [facility]    Report Status PENDING  Incomplete  Culture, respiratory (NON-Expectorated)     Status: None   Collection Time: 06/20/14  1:18 AM  Result Value Ref Range Status   Specimen Description TRACHEAL ASPIRATE  Final   Special Requests NONE  Final   Gram Stain   Final    MODERATE WBC PRESENT, PREDOMINANTLY PMN RARE SQUAMOUS EPITHELIAL CELLS  PRESENT RARE GRAM NEGATIVE RODS Performed at [facility]    Culture   Final    ABUNDANT SERRATIA MARCESCENS Performed at [facility]    Report Status 06/22/2014 FINAL  Final   Organism ID, Bacteria SERRATIA MARCESCENS  Final      Susceptibility   Serratia marcescens - MIC*    CEFAZOLIN >=64 RESISTANT Resistant     CEFEPIME <=1 SENSITIVE Sensitive     CEFTAZIDIME <=1 SENSITIVE Sensitive     CEFTRIAXONE <=1 SENSITIVE Sensitive     CIPROFLOXACIN <=0.25 SENSITIVE Sensitive     GENTAMICIN <=1 SENSITIVE Sensitive     TOBRAMYCIN <=1 SENSITIVE Sensitive     TRIMETH/SULFA <=20 SENSITIVE Sensitive     * ABUNDANT SERRATIA MARCESCENS  Clostridium Difficile by PCR     Status: Abnormal   Collection Time: 06/27/14 12:37 AM  Result Value Ref Range Status   C difficile by pcr POSITIVE (A) NEGATIVE Final    Comment: CRITICAL RESULT CALLED TO, READ BACK BY AND VERIFIED WITH: Durenda Age RN 9:50 06/27/14 ([email or web address])   Culture, respiratory (NON-Expectorated)     Status: None   Collection Time: 07/02/14 12:23 PM  Result Value Ref Range Status   Specimen Description TRACHEAL ASPIRATE  Final   Special Requests NONE  Final   Gram Stain   Final    FEW WBC PRESENT, PREDOMINANTLY PMN NO SQUAMOUS EPITHELIAL CELLS SEEN FEW GRAM NEGATIVE RODS Performed at [facility]   Final  ABUNDANT SERRATIA MARCESCENS Performed at [facility]    Report Status 07/05/2014 FINAL  Final   Organism ID, Bacteria SERRATIA MARCESCENS  Final      Susceptibility   Serratia marcescens - MIC*    CEFAZOLIN >=64 RESISTANT Resistant     CEFEPIME <=1 SENSITIVE Sensitive     CEFTAZIDIME <=1 SENSITIVE Sensitive     CEFTRIAXONE <=1 SENSITIVE Sensitive     CIPROFLOXACIN <=0.25 SENSITIVE Sensitive     GENTAMICIN <=1 SENSITIVE Sensitive     TOBRAMYCIN <=1 SENSITIVE Sensitive     TRIMETH/SULFA <=20 SENSITIVE Sensitive     * ABUNDANT SERRATIA MARCESCENS  Culture, blood (routine x  2)     Status: None   Collection Time: 07/02/14  2:48 PM  Result Value Ref Range Status   Specimen Description BLOOD LEFT HAND  Final   Special Requests BOTTLES DRAWN AEROBIC ONLY 2CC  Final   Culture   Final    NO GROWTH 5 DAYS Note: Culture results may be compromised due to an inadequate volume of blood received in culture bottles. Performed at [facility]    Report Status 07/08/2014 FINAL  Final  Urine culture     Status: None   Collection Time: 07/02/14  4:30 PM  Result Value Ref Range Status   Specimen Description URINE, RANDOM  Final   Special Requests Normal  Final   Colony Count NO GROWTH Performed at [facility]   Final   Culture NO GROWTH Performed at [facility]   Final   Report Status 07/03/2014 FINAL  Final  Culture, blood (routine x 2)     Status: None   Collection Time: 07/02/14  6:30 PM  Result Value Ref Range Status   Specimen Description BLOOD RIGHT HAND  Final   Special Requests BOTTLES DRAWN AEROBIC AND ANAEROBIC 10CC  Final   Culture   Final    KLEBSIELLA PNEUMONIAE Note: Confirmatory test for carbapenemase production is positive. COLISTIN 0.094ug/mL ETEST results for this drug are "FOR INVESTIGATIONAL USE ONLY" and should NOT be used for clinical purposes. CRITICAL RESULT CALLED TO, READ BACK BY AND VERIFIED WITH:  LATOYA HOWELL @ 0845 ON [id number] BY [facility] Note: Gram Stain Report Called to,Read Back By and Verified With: NADINE W@2 :33PM ON 07/03/14 BY DANTS Performed at [facility]    Report Status 07/07/2014 FINAL  Final   Organism ID, Bacteria KLEBSIELLA PNEUMONIAE  Final      Susceptibility   Klebsiella pneumoniae - MIC*    AMPICILLIN >=32 RESISTANT Resistant     AMPICILLIN/SULBACTAM >=32 RESISTANT Resistant     CEFAZOLIN >=64 RESISTANT Resistant     CEFEPIME RESISTANT      CEFTAZIDIME >=64 RESISTANT Resistant     CEFTRIAXONE >=64 RESISTANT Resistant     CIPROFLOXACIN >=4 RESISTANT Resistant     GENTAMICIN 8  INTERMEDIATE Intermediate     IMIPENEM >=16 RESISTANT Resistant     PIP/TAZO >=128 RESISTANT Resistant     TOBRAMYCIN >=16 RESISTANT Resistant     TRIMETH/SULFA >=320 RESISTANT Resistant     * KLEBSIELLA PNEUMONIAE  Culture, blood (routine x 2)     Status: None (Preliminary result)   Collection Time: 07/07/14  4:35 PM  Result Value Ref Range Status   Specimen Description BLOOD RIGHT HAND  Final   Special Requests BOTTLES DRAWN AEROBIC ONLY 10CC  Final   Culture   Final           BLOOD CULTURE RECEIVED NO  GROWTH TO DATE CULTURE WILL BE HELD FOR 5 DAYS BEFORE ISSUING A FINAL NEGATIVE REPORT Performed at [facility]    Report Status PENDING  Incomplete  Culture, blood (routine x 2)     Status: None (Preliminary result)   Collection Time: 07/07/14  4:50 PM  Result Value Ref Range Status   Specimen Description BLOOD RIGHT THUMB  Final   Special Requests BOTTLES DRAWN AEROBIC ONLY 3CC  Final   Culture   Final           BLOOD CULTURE RECEIVED NO GROWTH TO DATE CULTURE WILL BE HELD FOR 5 DAYS BEFORE ISSUING A FINAL NEGATIVE REPORT Performed at [facility]    Report Status PENDING  Incomplete    Studies/Results: Dg Chest 1 View  07/09/2014   CLINICAL DATA:  Pneumonia.  EXAM: CHEST - 1 VIEW  COMPARISON:  07/05/2014  FINDINGS: Tracheostomy tube terminates within the mid trachea. Left upper extremity PICC line tip projects over the superior cavoatrial junction. Stable enlarged cardiac and mediastinal contours. Minimal heterogeneous opacities right and left lower lungs. No pleural effusion or pneumothorax.  IMPRESSION: Minimal bibasilar heterogeneous opacities, suggestive of atelectasis.   Electronically Signed   By: Annia Belt M.D.   On: 07/09/2014 13:18      Assessment/Plan:  Active Problems:   UTI (lower urinary tract infection)   Respiratory failure   Sinus tachycardia   Pneumonia due to Serratia marcescens   Quadriplegia   Enterococcal bacteremia   Acute  respiratory failure   Atelectasis of left lung   Chronic respiratory failure   Hypokalemia   Pneumonia   Carbapenem-resistant Klebsiella pneumoniae infection   Gram-negative bacteremia   Severe sepsis   Clostridium difficile colitis   Fever    Kathryn Booth is a 44 y.o. 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 Per micro MIC is 1ug/uL S to AVYCAZ therefore should respond to drug.   I think Dr. Blair Dolphin reasoning per yesterday's note is rational  I will dc her old PICC, culture tip  Place new PICC  COMPLETE 14 days of AVYCAZ, preferably post PICC removal (she will not be able to get this at home)  #2 C. 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 Could consider changing her to doxycycline if the original organism was sensitive to it with a lower risk for C. difficile colitis than the dicloxacillin potentially and also with avoidance of double beta-lactam therapy that she has been having at present hospitalization  #4 Enterococcus bacteremia: sp 2 weeks therapy  (and this was truly in only 1/2 simultaneous  cultures  #5 Fevers:  I have looked at her temperature curve dating back to December 21 and I can rarely fine 2 days in a row without a fever above 101 if not 102. I suspect with her cervical spine injury that she has central fever on top of all her other problems I would not react to the fevers alone.  This patient is unfortunately going to INEVITABLY ACQUIRE AN EVEN MORE MDR ORGANISM, POTENTIALLY IN HER BLOOD BECAUSE I CANNOT IMAGINE PHYSICIANS FAILING TO REACT TO HER PERSISTENT FEVERS. Her prognosis is very poor but her family clearly is not engaged with that reality   I will sign off for now  Please call with further questions.        LOS: 21 days   Kathryn Booth 07/10/2014, 3:59 PM

## 2014-07-10 NOTE — H&P (Signed)
Telephone call made to PCCM about pain medication at the request of patients husband. Sister asked," if patient could receive a supplement called colloidal silver that could be placed into patient feed." Advised that this was something the hospital may not do as it is an alternative medication (home remedy) and it doesn't have the research behind its effectiveness. Will continue to monitor patient status. Melany GuernseyNakeiaRN

## 2014-07-10 NOTE — Progress Notes (Signed)
NUTRITION FOLLOW UP  Intervention:    Continue TF via PEG, Jevity 1.2 at 55 ml/h with Prostat 30 ml once daily, providing 1684 kcals, 88 gm protein, 1069 ml free water daily.   Nutrition Dx:   Inadequate oral intake related to inability to eat as evidenced by NPO status. Ongoing.  Goal:   Intake to meet >90% of estimated nutrition needs. Met.  Monitor:   TF tolerance/adequacy, weight trend, labs, vent status.  Assessment:   43 y.o. 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 Recently admitted to [facility] (06/07/14). Has had persistent tachycardia while at [facility]. Brought to [facility] ED 12/21 after husbands request for further evaluation of tachycardia. In ED, UA suggestive of UTI.   Patient has PEG in place with Jevity 1.2 infusing @ 55 ml/hr. 30 ml Prostat via tube once daily. Tube feeding regimen currently providing 1684 kcal, 88 grams protein, and 1069 ml H2O. Pt's weight has trended up 11 lbs in the past week; note +27 L fluid balance since admission.   Residuals: 0 ml to 30 ml Last bm: 1/10 Labs: low hemoglobin, low potassium  Patient with chronic trach on ventilator support MV: 7.3 L/min Temp (24hrs), Avg:99.5 F (37.5 C), Min:97.4 F (36.3 C), Max:102.2 F (39 C)  Propofol: none  Height: Ht Readings from Last 1 Encounters:  06/28/14 5' (1.524 m)    Weight Status:   Wt Readings from Last 1 Encounters:  07/10/14 154 lb 15.7 oz (70.3 kg)  07/03/14 143 lb 06/26/14 136 lb 3.9 oz (61.8 kg)   06/20/14 138 lb 10.7 oz (62.9 kg)    Re-estimated needs:  Kcal: 1688 Protein: 85-100 gm Fluid: 1.6-1.8 L  Skin: stage 2 pressure ulcer on coccyx; +1 generalized edema, +1 RUE and LUE edema  Diet Order:     Intake/Output Summary (Last 24 hours) at 07/10/14 1415 Last data filed at 07/10/14 0741  Gross per 24 hour  Intake      0 ml  Output   1425 ml  Net  -1425 ml    Last BM: 1/10  Labs:   Recent Labs Lab 07/03/14 1509 07/04/14 0500  07/09/14 1216  NA 136 133* 142  K 4.8 4.6 2.9*  CL 101 96 105  CO2 28 27 27  BUN 8 7 11  CREATININE <0.30* <0.30* <0.30*  CALCIUM 9.5 9.9 9.6  GLUCOSE 138* 139* 177*    CBG (last 3)   Recent Labs  07/08/14 2332 07/09/14 0756 07/10/14 0832  GLUCAP 142* 111* 116*    Scheduled Meds: . antiseptic oral rinse  7 mL Mouth Rinse QID  . ceftazidime avibactam (AVYCAZ) IVPB  2.5 g Intravenous 3 times per day  . chlorhexidine  15 mL Mouth Rinse BID  . dicloxacillin  500 mg Per Tube 3 times per day  . famotidine  20 mg Per Tube Daily  . feeding supplement (PRO-STAT SUGAR FREE 64)  30 mL Per Tube Q1500  . fentaNYL  25 mcg Transdermal Q72H  . heparin  5,000 Units Subcutaneous 3 times per day  . metoprolol tartrate  50 mg Per Tube BID  . midodrine  5 mg Per Tube TID WC  . sodium chloride  10-40 mL Intracatheter Q12H  . vancomycin  500 mg Per Tube QID    Continuous Infusions: . sodium chloride 10 mL/hr at 07/09/14 0630  . feeding supplement (JEVITY 1.2 CAL) 1,000 mL (07/10/14 0441)    Reanne Barnett RD, LDN Inpatient Clinical Dietitian Pager:   [phone] After Hours Pager: [phone]      

## 2014-07-10 NOTE — Progress Notes (Signed)
PULMONARY / CRITICAL CARE MEDICINE   Name: Kathryn Booth MRN: [id number] DOB: 17-Oct-1970    ADMISSION DATE:  06/19/2014 CONSULTATION DATE:  07/10/2014  [facility]ING MD :  [facility]  CHIEF COMPLAINT:  Tachycardia  INITIAL PRESENTATION:   44 y.o. 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  Recently admitted to [facility] (06/07/14).  Has had persistent tachycardia while at [facility].  Brought to [facility] ED 12/21 after husbands request for further evaluation of tachycardia.  In ED, UA suggestive of UTI.   STUDIES/SIGNIFICANT EVENTS: 10/12  Debridement of cervical abscess [facility]) 12/09  Admitted to [facility] 12/21  Admitted to [facility] with hx as above 12/28 Husband definitely wants to take patient home. Care manager says things are being set up. Temp now 102.3F -- ongoing fevers 101-103F. Controlled wit tylenol.  12/28 CT chest: Dense left lower lobe collapse/ consolidation, suggestive of pneumonia. There is patchy less confluent airspace disease in the left upper lobe. 01/06 Labile fevers 01/07 Blood culture from 01/03 positive for ESBL and carbapenem resistant Klebsiella. ID consult. AVYCAZ initiated (ceftaz-avibactam)  INDWELLING DEVICES:: Chronic trach  RUE PICC (from [facility])  >> 1/03 LUE PICC 1/03 >>   MICRO DATA: Urine 12/21 >> 30k multiple organisms Resp 12/22 >> Serratia (pansens) Blood 12/22 >> 1/2 enterococcus C diff 12/29 >> POSITIVE Blood 01/03 >> 1/2 ESBL, carbapenem-resistent Klebsiella   CURRENT ANTIMICROBIALS:  Dicloxacillin (for chronic MSSA osteo) >> 08/14/14 (planned) Vanc 12/24 >> 01/05 Cipro 12/21 >> 12/26, 01/03 >> 01/07 Enteral vanc 12/28 >>  Ceftaz 01/04 >> 01/07 Ceftaz-avibactam (AVYCAZ) 01/07 >>    SUBJECTIVE/OVERNIGHT/INTERVAL HX Appears unchanged   VITAL SIGNS: Temp:  [97.4 F (36.3 C)-102.2 F (39 C)] 102.2 F (39 C) (01/11 0740) Pulse Rate:  [97-113] 97 (01/11 1153) Resp:  [15-24] 20 (01/11 1153) BP: (108-117)/(66-69) 117/69  mmHg (01/11 0740) SpO2:  [100 %] 100 % (01/11 0929) FiO2 (%):  [30 %] 30 % (01/11 1153) Weight:  [70.3 kg (154 lb 15.7 oz)] 70.3 kg (154 lb 15.7 oz) (01/11 0500)   VENTILATOR SETTINGS: Vent Mode:  [-] SIMV;PSV FiO2 (%):  [30 %] 30 % Set Rate:  [15 bmp] 15 bmp Vt Set:  [45 mL-450 mL] 450 mL PEEP:  [5 cmH20] 5 cmH20 Pressure Support:  [10 cmH20] 10 cmH20 Plateau Pressure:  [20 cmH20] 20 cmH20   INTAKE / OUTPUT: Intake/Output      01/10 0701 - 01/11 0700 01/11 0701 - 01/12 0700   I.V. (mL/kg)     NG/GT     IV Piggyback     Total Intake(mL/kg)     Urine (mL/kg/hr) 1125 (0.7) 300 (0.8)   Total Output 1125 300   Net -1125 -300        Stool Occurrence  1 x     PHYSICAL EXAMINATION: General: NAD Neuro: RASS 0. , blinks on command HEENT: WNL Cardiovascular: hyperdynamic precordium, reg, 3/6 systolic M Lungs: clear anteriorly, unlabored on vent, occ rhonchi  Abdomen: soft, non tender, PEG site clean Ext: no edema, no cyanosis   Recent Labs Lab 07/03/14 1509 07/04/14 0500 07/09/14 1216  NA 136 133* 142  K 4.8 4.6 2.9*  CL 101 96 105  CO2 BUN CREATININE <0.30* <0.30* <0.30*  GLUCOSE 138* 139* 177*    Recent Labs Lab 07/04/14 0500 07/09/14 1216  HGB 9.7* 9.8*  HCT 30.8* 31.3*  WBC 10.3 10.8*  PLT 263 288     CXR: NNF  ASSESSMENT / PLAN:  Chronic VDRF; Chronic tracheostomy status Resolving PNA/ HCAP (serratia)  Plan:   Cont home vent Cont vent bundle  Klebsiella bacteremia - highly resistant (ESBL and carbapenemase producing) Plan:  Avycaz (ceftaz-avibactam) 2.5g IV q8h; per ID. Likely 2 weeks, started 1/7  C diff Colitis  Plan: Cont oral vanc, will need to continue x 14 days after completes abx  Sepsis Sinus tachycardia, controlled  87/ min on 1/9 Chronic hypotension, controlled on midodrine Plan:  Cont bb and midodrine   Chronic cervical osteomyelitis/diskitis - on dicloxacillin through 08/14/14.  Hypokalemia Plan Replace  and recheck  Hyperglycemia Plan Cont ssi  Protein calorie malnutrition  Plan Cont tubefeeds   Quadriplegic/ physical deconditioning Chronic Encephalopathy: etiology unclear-> hyperthermia vs hypoperfusion vs ongoing infection. Has had EEGs that are NOT c/w being locked in Plan Cont supportive care    No d/c today as home can't get ABX paid for at home.     Simonne MartinetPeter E Babcock ACNP-BC [facility] Pager # [phone] OR # [phone] if no answer  Reviewed above, examined.  She continues to have fever, and respiratory secretions.  Has b/l rhonchi.  Awaiting results of repeat cx.  Need to stay in hospital while getting Abx >> not able to arrange for home Abx therapy.  Husband still wants to arrange for home care >> not sure this is best option for her.  Will d/w him further depending on her progression.  Coralyn HellingVineet Raymound Katich, MD [facility] Pul[facility]Critical [facility] 07/10/2014, 6:07 PM Pager:  [phone] After 3pm call: [phone]

## 2014-07-10 NOTE — Progress Notes (Signed)
eLink Physician-Brief Progress Note Patient Name: Domingo SepDana Lashon Jann DOB: Aug 26, 1970 MRN: [id number]   Date of Service  07/10/2014  HPI/Events of Note  Pt in pain, pain meds had been stopped and unsure why  eICU Interventions  fent patch increased, prn iv fent ordered      Intervention Category Intermediate Interventions: Pain - evaluation and management  Shan Levansatrick Wright 07/10/2014, 9:13 PM

## 2014-07-11 LAB — BASIC METABOLIC PANEL
ANION GAP: 5 (ref 5–15)
BUN: 10 mg/dL (ref 6–23)
CO2: 28 mmol/L (ref 19–32)
Calcium: 9.6 mg/dL (ref 8.4–10.5)
Chloride: 107 mEq/L (ref 96–112)
Creatinine, Ser: <0.3 mg/dL — ABNORMAL LOW (ref 0.50–1.10)
GLUCOSE: 121 mg/dL — AB (ref 70–99)
Potassium: 3.5 mmol/L (ref 3.5–5.1)
SODIUM: 140 mmol/L (ref 135–145)

## 2014-07-11 LAB — GLUCOSE, CAPILLARY
GLUCOSE-CAPILLARY: 148 mg/dL — AB (ref 70–99)
Glucose-Capillary: 120 mg/dL — ABNORMAL HIGH (ref 70–99)

## 2014-07-11 NOTE — Progress Notes (Signed)
Spoke with Glee ArvinLatoya, Primary RN regarding PICC insertion.   This will be the 3rd PICC insertion for this patient.   Due to his infection the PICCs are being seeded with infection and we are being requested to remove and replace with new PICCs.  Questioning if we should insert a 3rd PICC in this patient while still dealing with infection.   RN notifying MD to clarify this prior to inserting a new PICC.

## 2014-07-11 NOTE — Progress Notes (Signed)
PULMONARY / CRITICAL CARE MEDICINE   Name: Kathryn Booth MRN: [id number] DOB: 05-07-1971    ADMISSION DATE:  06/19/2014 CONSULTATION DATE:  07/11/2014  REFERRING MD :  [facility]  CHIEF COMPLAINT:  Tachycardia  INITIAL PRESENTATION:   44 y.o. 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  Recently admitted to [facility] (06/07/14).  Has had persistent tachycardia while at [facility].  Brought to [facility] ED 12/21 after husbands request for further evaluation of tachycardia.  In ED, UA suggestive of UTI.   STUDIES/SIGNIFICANT EVENTS: 10/12  Debridement of cervical abscess [facility]) 12/09  Admitted to [facility] 12/21  Admitted to [facility] with hx as above 12/28 Husband definitely wants to take patient home. Care manager says things are being set up. Temp now 102.29F -- ongoing fevers 101-103F. Controlled wit tylenol.  12/28 CT chest: Dense left lower lobe collapse/ consolidation, suggestive of pneumonia. There is patchy less confluent airspace disease in the left upper lobe. 01/06 Labile fevers 01/07 Blood culture from 01/03 positive for ESBL and carbapenem resistant Klebsiella. ID consult. AVYCAZ initiated (ceftaz-avibactam)  INDWELLING DEVICES:: Chronic trach  RUE PICC (from [facility])  >> 1/03 LUE PICC 1/03 >>   MICRO DATA: Urine 12/21 >> 30k multiple organisms Resp 12/22 >> Serratia (pansens) Blood 12/22 >> 1/2 enterococcus C diff 12/29 >> POSITIVE Blood 01/03 >> 1/2 ESBL, carbapenem-resistent Klebsiella   CURRENT ANTIMICROBIALS:  Dicloxacillin (for chronic MSSA osteo) >> 08/14/14 (planned) Vanc 12/24 >> 01/05 Cipro 12/21 >> 12/26, 01/03 >> 01/07 Enteral vanc 12/28 >>  Ceftaz 01/04 >> 01/07 Ceftaz-avibactam (AVYCAZ) 01/07 >>    SUBJECTIVE/OVERNIGHT/INTERVAL HX Appears unchanged   VITAL SIGNS: Temp:  [98.3 F (36.8 C)-101.2 F (38.4 C)] 99.4 F (37.4 C) (01/12 0945) Pulse Rate:  [86-110] 99 (01/12 0945) Resp:  [15-28] 28 (01/12 0945) BP: (110-140)/(62-87) 132/79  mmHg (01/12 0945) SpO2:  [98 %-100 %] 100 % (01/12 0945) FiO2 (%):  [30 %] 30 % (01/12 0945)   VENTILATOR SETTINGS: Vent Mode:  [-] SIMV FiO2 (%):  [30 %] 30 % Set Rate:  [15 bmp] 15 bmp Vt Set:  [450 mL] 450 mL PEEP:  [5 cmH20] 5 cmH20 Pressure Support:  [10 cmH20] 10 cmH20 Plateau Pressure:  [24 cmH20-25 cmH20] 24 cmH20   INTAKE / OUTPUT: Intake/Output      01/11 0701 - 01/12 0700 01/12 0701 - 01/13 0700   Urine (mL/kg/hr) 1550 (0.9) 250 (1.1)   Total Output 1550 250   Net -1550 -250        Stool Occurrence 2 x      PHYSICAL EXAMINATION: General: NAD Neuro: RASS 0. , blinks on command HEENT: WNL Cardiovascular: hyperdynamic precordium, reg, 3/6 systolic M Lungs: clear anteriorly, unlabored on vent, occ rhonchi  Abdomen: soft, non tender, PEG site clean Ext: no edema, no cyanosis   Recent Labs Lab 07/09/14 1216 07/11/14 0422  NA 142 140  K 2.9* 3.5  CL 105 107  CO2 27 28  BUN 11 10  CREATININE <0.30* <0.30*  GLUCOSE 177* 121*    Recent Labs Lab 07/09/14 1216  HGB 9.8*  HCT 31.3*  WBC 10.8*  PLT 288    CXR: NNF  ASSESSMENT / PLAN:  Chronic VDRF; Chronic tracheostomy status Resolving PNA/ HCAP (serratia)  Plan:   Cont home vent Cont vent bundle  Klebsiella bacteremia - highly resistant (ESBL and carbapenemase producing) Plan:  Avycaz (ceftaz-avibactam) 2.5g IV q8h; per ID. Plan for 2 weeks; after new PICC line  Needs NEW  PICC    C diff Colitis  Plan: Cont oral vanc, will need to continue x 14 days after completes abx  Sepsis Sinus tachycardia, controlled  87/ min on 1/9 Chronic hypotension, controlled on midodrine Plan:  Cont bb and midodrine   Chronic cervical osteomyelitis/diskitis - on dicloxacillin through 08/14/14.  Hypokalemia Plan Replace and recheck  Hyperglycemia Plan Cont ssi  Protein calorie malnutrition  Plan Cont tubefeeds   Quadriplegic/ physical deconditioning Chronic Encephalopathy: etiology unclear->  hyperthermia vs hypoperfusion vs ongoing infection. Has had EEGs that are NOT c/w being locked in Plan Cont supportive care Husband requests PT consult and SLP   Simonne MartinetPeter E Babcock ACNP-BC [facility]/Critical [facility] Pager # [phone] OR # [phone] if no answer    Reviewed above, examined.  No further fever. Cx results from 1/08 still pending.  Scattered rhonchi.  Continue home vent.  Continue avycaz and dicloxacillin per ID.  Husband still planning for discharge home once she has completed course of Abx.  Coralyn HellingVineet Johm Pfannenstiel, MD [facility] 07/11/2014, 11:16 AM Pager:  [phone] After 3pm call: [phone]

## 2014-07-11 NOTE — Evaluation (Signed)
Speech Language Pathology Evaluation Patient Details Name: Kathryn Booth MRN: [id number] DOB: Mar 17, 1971 Today's Date: 07/11/2014 Time: 4098-1191 SLP Time Calculation (min) (ACUTE ONLY): 24 min  Problem List:  Patient Active Problem List   Diagnosis Date Noted  . Fever   . Carbapenem-resistant Klebsiella pneumoniae infection   . Gram-negative bacteremia   . Severe sepsis   . Clostridium difficile colitis   . Atelectasis of left lung   . Chronic respiratory failure   . Hypokalemia   . Pneumonia   . Acute respiratory failure   . Enterococcal bacteremia 06/22/2014  . Respiratory failure   . Sinus tachycardia   . Pneumonia due to Serratia marcescens   . Quadriplegia   . UTI (lower urinary tract infection) 06/19/2014   Past Medical History:  Past Medical History  Diagnosis Date  . Osteomyelitis   . Epidural abscess   . Respiratory failure   . Metabolic encephalopathy   . Nonischemic cardiomyopathy   . Hypotension   . Osteomyelitis of vertebra of cervical region   . Bipolar 1 disorder   . Anxiety disorder   . Hypoxemia   . Bacteremia   . Hypoxic ischemic encephalopathy (HIE)   . Cutaneous abscess of back excluding buttocks   . Quadriplegia   . Dysphagia, oropharyngeal phase   . Tracheostomy in place    Past Surgical History: History reviewed. No pertinent past surgical history. HPI:  44 y.o. 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 Chronic trach and vent. Chronic Encephalopathy: etiology unclear-> hyperthermia vs hypoperfusion vs ongoing infection. Has had EEGs that are NOT c/w being locked in.  Recently admitted to [facility] (06/07/14). Has had persistent tachycardia while at [facility]. Brought to [facility] ED 12/21 after husband's request for further evaluation of tachycardia. 01/07 Blood culture from 01/03 positive for ESBL and carbapenem resistant Klebsiella. ID consult. AVYCAZ initiated (ceftaz-avibactam).     Assessment / Plan /  Recommendation Clinical Impression  Pt presents with significant communication deficits secondary to tracheostomy, as well as cognitive impairment impacting initiation/effort to make needs known.  Pt presents with limited/no oral-motor movements; is able to move upper face to command; tracks to left and right, superiorly and inferiorly and does so on command.  Pt demonstrates emerging ability to answer yes/no questions with eye blink; however, fatigue near end of session limited full participation.  Attempts to use eye scanning as form of communication show promise given ability to scan and follow some directions.  Recommend acute SLP tx to address augmentative communication and investigate possibility of inline PMV.  Pt's husband in agreement.     SLP Assessment  Patient needs continued Speech Lanaguage Pathology Services    Follow Up Recommendations   (tba)    Frequency and Duration min 3x week  2 weeks   Pertinent Vitals/Pain Pain Assessment: Faces Faces Pain Scale: No hurt   SLP Goals  Potential to Achieve Goals (ACUTE ONLY): Fair  SLP Evaluation Prior Functioning  Cognitive/Linguistic Baseline: Within functional limits (hx bipolar) Vocation: On disability   Cognition  Overall Cognitive Status: Impaired/Different from baseline Arousal/Alertness: Awake/alert (difficulty sustaining LOA) Attention: Sustained Sustained Attention: Impaired Sustained Attention Impairment: Verbal basic;Functional basic Comments: unable to fully assess cognition at this time    Comprehension  Auditory Comprehension Overall Auditory Comprehension: Impaired Yes/No Questions: Impaired Basic Biographical Questions: Other (comment) (reliable yes/no response not established) Commands: Impaired One Step Basic Commands: Other (comment) (able to follow simple directions with bilateral upper face) Visual Recognition/Discrimination Discrimination: Not  tested Reading Comprehension Reading Status: Not tested     Expression Expression Primary Mode of Expression:  (eye movements/blinking) Verbal Expression Overall Verbal Expression: Impaired at baseline Non-Verbal Means of Communication: Eye blink Written Expression Written Expression: Unable to assess (comment)   Oral / Motor Oral Motor/Sensory Function Overall Oral Motor/Sensory Function: Impaired (limited volitional movements of tongue/jaw/lips; able to rai) Motor Speech Overall Motor Speech:  (no functional articulation; no attempts at mouth movements) Phonation: Aphonic (secondary to trach)   GO    [name] Fredericouture, [place] CCC/SLP Pager [phone]  [name], [name] 07/11/2014, 2:56 PM

## 2014-07-11 NOTE — Progress Notes (Signed)
PICC tip cut and put into sterile container for culture as ordered. Glee ArvinLatoya, RN notified to send to lab.

## 2014-07-12 LAB — GLUCOSE, CAPILLARY
GLUCOSE-CAPILLARY: 122 mg/dL — AB (ref 70–99)
Glucose-Capillary: 121 mg/dL — ABNORMAL HIGH (ref 70–99)
Glucose-Capillary: 117 mg/dL — ABNORMAL HIGH (ref 70–99)
Glucose-Capillary: 135 mg/dL — ABNORMAL HIGH (ref 70–99)

## 2014-07-12 NOTE — Evaluation (Signed)
Physical Therapy Evaluation Patient Details Name: Kathryn Booth MRN: [id number] DOB: [birth year] Today's Date: 07/12/2014   History of Present Illness  44 y.o. 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  Recently admitted to [facility] (06/07/14).  Has had persistent tachycardia while at [facility].  [facility] 12/21 after husbands request for further evaluation of tachycardia.  In ED, UA suggestive of UTI.  Clinical Impression  Patient seen for therapy evaluation. Patient with minimal interactions vie eyes (tracking). One noted incidence of patient turning head slightly to the left. No further active movement noted throughout session. Spoke with spouse regarding therapy expectations and patient ability to participate. Will trial PT to perform home education for positioning and self care with spouse in the event of d/c home (per spouse request). OF NOTE, patient will need AIR MATTRESS OVERLAY secondary to inability to perform active movement and protect skin from break down over bony prominence areas.    Follow Up Recommendations Home health PT(per spouse request for home management and education if returning home, otherwise recommend vent SNF)    Equipment Recommendations  Hospital bed;Other (comment) (air mattress, hoyer lift, reclining w/c)    Recommendations for Other Services       Precautions / Restrictions Precautions Precautions:  (Vent) Precaution Comments: BP checks with positioning, [id number]:Quadriplegia Restrictions Other Position/Activity Restrictions: maintain BP status (autonomic dysreflexia risk)      Mobility  Bed Mobility Overal bed mobility: Needs Assistance;+2 for physical assistance;+ 2 for safety/equipment Bed Mobility: Rolling Rolling: Total assist;+2 for safety/equipment;+2 for physical assistance            Transfers                    Ambulation/Gait                Stairs            Wheelchair Mobility    Modified Rankin (Stroke Patients Only)       Balance                                             Pertinent Vitals/Pain      Home Living Family/patient expects to be discharged to:: Private residence Living Arrangements: Spouse/significant other Available Help at Discharge: Family;Other (Comment) (spouse has HD 3 x wk)           Home Equipment: None Additional Comments: spouse wants patient to return home with nursing care and PT    Prior Function Level of Independence: Needs assistance   Gait / Transfers Assistance Needed: total assist  ADL's / Homemaking Assistance Needed: total care  Comments: pt husband, patient was recieving PT services at other facilities, patient has not demonstrates active movement in any extremity since october, but has been able to actively open eyes as well as perform minimal neck movement. States that they would use special chair to get patient OOB.     Hand Dominance        Extremity/Trunk Assessment   Upper Extremity Assessment: RUE deficits/detail;LUE deficits/detail RUE Deficits / Details: increased edema noted, no active movement     LUE Deficits / Details: increased edema noted, no active movement   Lower Extremity Assessment: RLE deficits/detail;LLE deficits/detail RLE Deficits / Details: increased edema noted, no active movement, full passive ROM, flaccid throughout extremity, no clonus noted LLE  Deficits / Details: increased edema noted, no active movement, full passive ROM, flaccid throughout extremity, no clonus noted     Communication   Communication: Other (comment) (intubated)  Cognition Arousal/Alertness: Lethargic   Overall Cognitive Status: Impaired/Different from baseline                      General Comments      Exercises  PROM all extremities, positioning for elevation and edema control as well as prevention of skin breakdown.      Assessment/Plan    PT Assessment  Patient needs continued PT services  PT Diagnosis Quadraplegia   PT Problem List Decreased activity tolerance;Decreased mobility;Decreased skin integrity;Cardiopulmonary status limiting activity;Impaired tone;Decreased strength  PT Treatment Interventions Therapeutic activities;Patient/family education   PT Goals (Current goals can be found in the Care Plan section) Acute Rehab PT Goals PT Goal Formulation: With family Time For Goal Achievement: 07/26/14 Potential to Achieve Goals: Fair    Frequency Min 1X/week   Barriers to discharge        Co-evaluation               End of Session Equipment Utilized During Treatment:  (ventilator) Activity Tolerance: Patient limited by lethargy Patient left: in bed;with call bell/phone within reach;with family/visitor present Nurse Communication: Other (comment) (needs air mattress overlay)         Time: 0454-0981 PT Time Calculation (min) (ACUTE ONLY): 19 min   Charges:   PT Evaluation $Initial PT Evaluation Tier I: 1 Procedure PT Treatments $Self Care/Home Management: 8-22   PT G CodesFabio Asa 25-Jul-2014, 4:07 PM Charlotte Crumb, PT DPT  [phone]

## 2014-07-12 NOTE — Progress Notes (Signed)
PULMONARY / CRITICAL CARE MEDICINE   Name: Kathryn Booth MRN: [id number] DOB: Nov 15, 1970    ADMISSION DATE:  06/19/2014 CONSULTATION DATE:  07/12/2014  [facility]ING MD :  [facility]  CHIEF COMPLAINT:  Tachycardia  INITIAL PRESENTATION:   44 y.o. 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  Recently admitted to [facility] (06/07/14).  Has had persistent tachycardia while at [facility].  Brought to [facility] ED 12/21 after husbands request for further evaluation of tachycardia.  In ED, UA suggestive of UTI.   STUDIES/SIGNIFICANT EVENTS: 10/12  Debridement of cervical abscess [facility]) 12/09  Admitted to [facility] 12/21  Admitted to [facility] with hx as above 12/28 Husband definitely wants to take patient home. Care manager says things are being set up. Temp now 102.31F -- ongoing fevers 101-103F. Controlled wit tylenol.  12/28 CT chest: Dense left lower lobe collapse/ consolidation, suggestive of pneumonia. There is patchy less confluent airspace disease in the left upper lobe. 01/06 Labile fevers 01/07 Blood culture from 01/03 positive for ESBL and carbapenem resistant Klebsiella. ID consult. AVYCAZ initiated (ceftaz-avibactam)  INDWELLING DEVICES:: Chronic trach  RUE PICC (from [facility])  >> 1/03 LUE PICC 1/03 >> 1/12 1/12 new PICC   MICRO DATA: Urine 12/21 >> 30k multiple organisms Resp 12/22 >> Serratia (pansens) Blood 12/22 >> 1/2 enterococcus C diff 12/29 >> POSITIVE Blood 01/03 >> 1/2 ESBL, carbapenem-resistent Klebsiella   CURRENT ANTIMICROBIALS:  Dicloxacillin (for chronic MSSA osteo) >> 08/14/14 (planned) Vanc 12/24 >> 01/05 Cipro 12/21 >> 12/26, 01/03 >> 01/07 Enteral vanc 12/28 >>  Ceftaz 01/04 >> 01/07 Ceftaz-avibactam (AVYCAZ) 01/07 >>    SUBJECTIVE/OVERNIGHT/INTERVAL HX Appears unchanged   VITAL SIGNS: Temp:  [98.7 F (37.1 C)-99.4 F (37.4 C)] 98.7 F (37.1 C) (01/13 0445) Pulse Rate:  [85-105] 95 (01/13 0445) Resp:  [15-28] 22 (01/13 0445) BP:  (96-132)/(65-79) 102/66 mmHg (01/13 0445) SpO2:  [100 %] 100 % (01/13 0445) FiO2 (%):  [30 %] 30 % (01/13 0445) Weight:  [66.5 kg (146 lb 9.7 oz)] 66.5 kg (146 lb 9.7 oz) (01/13 0500)   VENTILATOR SETTINGS: Vent Mode:  [-] SIMV FiO2 (%):  [30 %] 30 % Set Rate:  [15 bmp] 15 bmp Vt Set:  [450 mL] 450 mL PEEP:  [5 cmH20-10 cmH20] 5 cmH20 Pressure Support:  [10 cmH20] 10 cmH20 Plateau Pressure:  [20 cmH20-24 cmH20] 22 cmH20   INTAKE / OUTPUT: Intake/Output      01/12 0701 - 01/13 0700 01/13 0701 - 01/14 0700   NG/GT 1320    IV Piggyback 50    Total Intake(mL/kg) 1370 (20.6)    Urine (mL/kg/hr) 1200 (0.8)    Stool     Total Output 1200     Net +170            PHYSICAL EXAMINATION: General: NAD Neuro: RASS 0. , blinks on command HEENT: WNL Cardiovascular: hyperdynamic precordium, reg, 3/6 systolic M Lungs: clear anteriorly, unlabored on vent, diffuse rhonchi  Abdomen: soft, non tender, PEG site clean Ext: no edema, no cyanosis   Recent Labs Lab 07/09/14 1216 07/11/14 0422  NA 142 140  K 2.9* 3.5  CL 105 107  CO2 27 28  BUN 11 10  CREATININE <0.30* <0.30*  GLUCOSE 177* 121*    Recent Labs Lab 07/09/14 1216  HGB 9.8*  HCT 31.3*  WBC 10.8*  PLT 288    CXR: NNF  ASSESSMENT / PLAN:  Chronic VDRF; Chronic tracheostomy status Resolving PNA/ HCAP (serratia)  Plan:  Cont home vent Cont vent bundle SLP will cont to work w/ augmentative communication   Klebsiella bacteremia - highly resistant (ESBL and carbapenemase producing) Plan:  Avycaz (ceftaz-avibactam) 2.5g IV q8h; per ID. Plan for 2 weeks; after new PICC line (palced 1/12)  C diff Colitis  Plan: Cont oral vanc, will need to continue x 14 days after completes abx  Sepsis Sinus tachycardia, controlled  87/ min on 1/9 Chronic hypotension, controlled on midodrine Plan:  Cont bb and midodrine   Chronic cervical osteomyelitis/diskitis - on dicloxacillin through  08/14/14.  Hypokalemia Plan Replaced and recheck in am 1/14  Hyperglycemia Plan Cont ssi  Protein calorie malnutrition  Plan Cont tubefeeds   Quadriplegic/ physical deconditioning Chronic Encephalopathy: etiology unclear-> hyperthermia vs hypoperfusion vs ongoing infection. Has had EEGs that are NOT c/w being locked in Plan Cont supportive care PT/ SLP ordered. Doubt much to benefit   Simonne MartinetPeter E Babcock ACNP-BC [facility] Pulmonary/Critical Care Pager # [phone] OR # [phone] if no answer  Continue abx, continue home vent, need vanc for 14 days.  Midodrine as ordered.  Continue TF.  Patient seen and examined, agree with above note.  I dictated the care and orders written for this patient under my direction.  Alyson ReedyWesam G Yacoub, MD [phone]

## 2014-07-13 LAB — GLUCOSE, CAPILLARY
GLUCOSE-CAPILLARY: 134 mg/dL — AB (ref 70–99)
Glucose-Capillary: 128 mg/dL — ABNORMAL HIGH (ref 70–99)
Glucose-Capillary: 116 mg/dL — ABNORMAL HIGH (ref 70–99)

## 2014-07-13 NOTE — Trach Care Team (Signed)
Trach Care Progression Note   Patient Details Name: Kathryn Booth MRN: [id number] DOB: 1971/03/04 Today's Date: 07/13/2014   Tracheostomy Assessment    Tracheostomy Shiley 6 mm Cuffed (Active)  Status Secured 07/13/2014  1:10 PM  Site Assessment Clean;Dry 07/13/2014  1:10 PM  Site Care Cleansed;Dried;Dressing applied 07/13/2014  3:30 AM  Inner Cannula Care Changed/new 07/13/2014  3:30 AM  Ties Assessment Clean;Dry;Secure 07/13/2014  1:10 PM  Cuff pressure (cm) 26 cm 07/13/2014  5:13 AM  Trach Changed Yes 07/12/2014  1:00 PM  Emergency Equipment at bedside Yes 07/13/2014  1:10 PM     Care Needs     Respiratory Therapy O2 Device: Ventilator FiO2 (%): 30 % SpO2: 100 % Who was educated?:  (none needed) Follow up recommendations:  (follow as needed) Respiratory barriers to progression:  (plan is for pt to go home in vent with husband )    Speech Language Pathology  SLP chart review complete: Patient does not need SLP services at this time Follow up Recommendations:  (tba)   Physical Therapy PT Recommendation/Assessment: Patient needs continued PT services Follow Up Recommendations: Home health PT PT equipment: Hospital bed, Other (comment) (air mattress, hoyer lift, reclining w/c)    Occupational Therapy      Nutritional Patient's Current Diet: Tube feeding Tube Feeding: Jevity 1.2 Cal Tube Feeding Frequency: Continuous Tube Feeding Strength: Full strength    Case Management/Social Work Level of patient care prior to hospitalization: LTACH ([facility]) Insurance payer: Medicaid Barriers to progression: GNR on blood cultures 1/5. Plan to address barriers: DC plan pending Anticipated discharge disposition: Home-Home Health ([facility] to manage vent at home.)    Provider Trach Care Team/Provider                         Recommendations Trach Care Team Members Present-  Cherylin MylarLauren Doyle, RT & Joaquin CourtsKimberly Harris, RD       None          Cyndel Griffey, Silva BandyDebra Anita ((scribe for  team)  07/13/2014, 2:20 PM

## 2014-07-13 NOTE — Progress Notes (Signed)
PULMONARY / CRITICAL CARE MEDICINE   Name: Kathryn Booth MRN: [id number] DOB: 21-Apr-1971    ADMISSION DATE:  06/19/2014 CONSULTATION DATE:  07/13/2014  REFERRING MD :  [facility]  CHIEF COMPLAINT:  Tachycardia  INITIAL PRESENTATION:   44 y.o. 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  Recently admitted to [facility] (06/07/14).  Has had persistent tachycardia while at [facility].  [facility] after husbands request for further evaluation of tachycardia.  In ED, UA suggestive of UTI.   STUDIES/SIGNIFICANT EVENTS: 10/12  [facility]brid[facility] cervical [facility] 12/09  Admitted to [facility] 12/21  [facility]28 Husband definitely wants to take patient home. Care manager says things are being set up. Temp now 102.57F -- ongoing fevers 101-103F. Controlled wit tylenol.  12/28 CT chest: Dense left lower lobe collapse/ consolidation, suggestive of pneumonia. There is patchy less confluent airspace disease in the left upper lobe. 01/06 Labile fevers 01/07 Blood culture from 01/03 positive for ESBL and carbapenem resistant Klebsiella. ID consult. AVYCAZ initiated (ceftaz-avibactam)  INDWELLING DEVICES:: Chronic trach  RUE PICC (from [facility])  >> 1/03 LUE PICC 1/03 >> 1/12 1/12 new PICC   MICRO DATA: Urine 12/21 >> 30k multiple organisms Resp 12/22 >> Serratia (pansens) Blood 12/22 >> 1/2 enterococcus C diff 12/29 >> POSITIVE Blood 01/03 >> 1/2 ESBL, carbapenem-resistent Klebsiella   CURRENT ANTIMICROBIALS:  Dicloxacillin (for chronic MSSA osteo) >> 08/14/14 (planned) Vanc 12/24 >> 01/05 Cipro 12/21 >> 12/26, 01/03 >> 01/07 Enteral vanc 12/28 >>  Ceftaz 01/04 >> 01/07 Ceftaz-avibactam (AVYCAZ) 01/07 >>    SUBJECTIVE/OVERNIGHT/INTERVAL HX Appears unchanged   VITAL SIGNS: Temp:  [97 F (36.1 C)-98.6 F (37 C)] 98.6 F (37 C) (01/14 0823) Pulse Rate:  [88-124] 95 (01/14 0901) Resp:  [18-28] 24 (01/14 0823) BP:  (106-133)/(68-79) 131/78 mmHg (01/14 0901) SpO2:  [95 %-100 %] 100 % (01/14 1220) FiO2 (%):  [30 %] 30 % (01/14 1220)   VENTILATOR SETTINGS: Vent Mode:  [-] SIMV FiO2 (%):  [30 %] 30 % Set Rate:  [15 bmp] 15 bmp Vt Set:  [450 mL] 450 mL PEEP:  [5 cmH20] 5 cmH20 Pressure Support:  [10 cmH20] 10 cmH20 Plateau Pressure:  [14 cmH20-22 cmH20] 22 cmH20   INTAKE / OUTPUT: Intake/Output      01/13 0701 - 01/14 0700 01/14 0701 - 01/15 0700   I.V. (mL/kg) 105 (1.6)    NG/GT 1265    IV Piggyback 100    Total Intake(mL/kg) 1470 (22.1)    Urine (mL/kg/hr) 1250 (0.8)    Stool 0 (0)    Total Output 1250     Net +220          Stool Occurrence 2 x      PHYSICAL EXAMINATION: General: NAD Neuro: RASS 0. , blinks on command but otherwise non-communicative. HEENT: WNL Cardiovascular: RRR, reg, 3/6 systolic M Lungs: clear anteriorly, unlabored on vent, diffuse rhonchi  Abdomen: soft, non tender, PEG site clean Ext: no edema, no cyanosis   Recent Labs Lab 07/09/14 1216 07/11/14 0422  NA 142 140  K 2.9* 3.5  CL 105 107  CO2 27 28  BUN 11 10  CREATININE <0.30* <0.30*  GLUCOSE 177* 121*    Recent Labs Lab 07/09/14 1216  HGB 9.8*  HCT 31.3*  WBC 10.8*  PLT 288    CXR: NNF  ASSESSMENT / PLAN:  Chronic VDRF; Chronic tracheostomy status Resolving PNA/ HCAP (serratia)  Plan:  Cont home vent Cont vent bundle SLP will cont to work w/ augmentative communication however highly doubt will help.  Klebsiella bacteremia - highly resistant (ESBL and carbapenemase producing) Plan:  Avycaz (ceftaz-avibactam) 2.5g IV q8h; per ID. Plan for 2 weeks; after new PICC line (palced 1/12)  C diff Colitis  Plan: Cont oral vanc, will need to continue x 14 days after completes abx  Sepsis Sinus tachycardia, controlled  87/ min on 1/9 Chronic hypotension, controlled on midodrine Plan:  Cont bb and midodrine   Chronic cervical osteomyelitis/diskitis - on dicloxacillin through  08/14/14.  Hypokalemia Plan Replaced and recheck in am 1/14  Hyperglycemia Plan Cont ssi  Protein calorie malnutrition  Plan Cont tubefeeds   Quadriplegic/ physical deconditioning Chronic Encephalopathy: etiology unclear-> hyperthermia vs hypoperfusion vs ongoing infection. Has had EEGs that are NOT c/w being locked in Plan Cont supportive care PT and speech following.  Alyson Reedy, M.D. [facility] Pulmonary/Critical Care Medicine. Pager: [phone]. After hours pager: [phone].

## 2014-07-14 LAB — CULTURE, BLOOD (ROUTINE X 2)
Culture: NO GROWTH
Culture: NO GROWTH

## 2014-07-14 LAB — CBC
HEMATOCRIT: 32.2 % — AB (ref 36.0–46.0)
HEMOGLOBIN: 10 g/dL — AB (ref 12.0–15.0)
MCH: 29.4 pg (ref 26.0–34.0)
MCHC: 31.1 g/dL (ref 30.0–36.0)
MCV: 94.7 fL (ref 78.0–100.0)
Platelets: 268 10*3/uL (ref 150–400)
RBC: 3.4 MIL/uL — ABNORMAL LOW (ref 3.87–5.11)
RDW: 14.4 % (ref 11.5–15.5)
WBC: 9 10*3/uL (ref 4.0–10.5)

## 2014-07-14 LAB — GLUCOSE, CAPILLARY
GLUCOSE-CAPILLARY: 126 mg/dL — AB (ref 70–99)
GLUCOSE-CAPILLARY: 145 mg/dL — AB (ref 70–99)
GLUCOSE-CAPILLARY: 97 mg/dL (ref 70–99)
Glucose-Capillary: 139 mg/dL — ABNORMAL HIGH (ref 70–99)

## 2014-07-14 LAB — PHOSPHORUS: PHOSPHORUS: 5.2 mg/dL — AB (ref 2.3–4.6)

## 2014-07-14 LAB — CATH TIP CULTURE: CULTURE: NO GROWTH

## 2014-07-14 LAB — BASIC METABOLIC PANEL
Anion gap: 10 (ref 5–15)
BUN: 8 mg/dL (ref 6–23)
CHLORIDE: 102 meq/L (ref 96–112)
CO2: 30 mmol/L (ref 19–32)
Calcium: 9.6 mg/dL (ref 8.4–10.5)
Creatinine, Ser: <0.3 mg/dL — ABNORMAL LOW (ref 0.50–1.10)
Glucose, Bld: 166 mg/dL — ABNORMAL HIGH (ref 70–99)
POTASSIUM: 3 mmol/L — AB (ref 3.5–5.1)
Sodium: 142 mmol/L (ref 135–145)

## 2014-07-14 LAB — MAGNESIUM: Magnesium: 1.9 mg/dL (ref 1.5–2.5)

## 2014-07-14 MED ORDER — ENOXAPARIN SODIUM 40 MG/0.4ML ~~LOC~~ SOLN
40.0000 mg | SUBCUTANEOUS | Status: DC
  Administered 2014-07-14 – 2014-07-26 (×13): 40 mg via SUBCUTANEOUS
  Filled 2014-07-14 (×14): qty 0.4

## 2014-07-14 MED ORDER — FENTANYL 50 MCG/HR TD PT72
50.0000 ug | MEDICATED_PATCH | TRANSDERMAL | Status: DC
  Administered 2014-07-14 – 2014-07-26 (×5): 50 ug via TRANSDERMAL
  Filled 2014-07-14 (×5): qty 1

## 2014-07-14 MED ORDER — POTASSIUM CHLORIDE 20 MEQ/15ML (10%) PO SOLN
40.0000 meq | ORAL | Status: AC
  Administered 2014-07-14 (×2): 40 meq
  Filled 2014-07-14 (×2): qty 30

## 2014-07-14 NOTE — Progress Notes (Signed)
Speech Language Pathology Treatment: Cognitive-Linquistic  Patient Details Name: Kathryn Booth MRN: [id number] DOB: 01-22-1971 Today's Date: 07/14/2014 Time: [id number] SLP Time Calculation (min) (ACUTE ONLY): 18 min  Assessment / Plan / Recommendation Clinical Impression  Initially, pt was intermittently following commands with her eyes with Max multimodal cueing from therapist to attempt eye blinks and visual scanning to answer yes/no questions, making accuracy very limited. However, as session progressed pt's level of attention improved and she was able to scan between two written options ("yes" and "no") to answer basic questions with ~80% accuracy. Will continue to follow to maximize functional communication.   HPI HPI: 44 y.o. 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 Chronic trach and vent. Chronic Encephalopathy: etiology unclear-> hyperthermia vs hypoperfusion vs ongoing infection. Has had EEGs that are NOT c/w being locked in.  Recently admitted to [facility] (06/07/14). Has had persistent tachycardia while at [facility]. [facility] after husbands request for further evaluation of tachycardia. 01/07 Blood culture from 01/03 positive for ESBL and carbapenem resistant Klebsiella. ID consult. AVYCAZ initiated (ceftaz-avibactam).     Pertinent Vitals Pain Assessment: No/denies pain  SLP Plan  Continue with current plan of care    Recommendations      Follow up Recommendations:  (tba) Plan: Continue with current plan of care    GO     Maxcine HamLaura Paiewonsky, M.A. CCC-SLP [phone]  Maxcine Hamaiewonsky, Kennie Karapetian 07/14/2014, 3:17 PM

## 2014-07-14 NOTE — Progress Notes (Signed)
PULMONARY / CRITICAL CARE MEDICINE   Name: Kathryn Booth MRN: [id number] DOB: 10/07/1970    ADMISSION DATE:  06/19/2014 CONSULTATION DATE:  07/14/2014  REFERRING MD :  [facility]  CHIEF COMPLAINT:  Tachycardia  INITIAL PRESENTATION:   44 y.o. 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  Recently admitted to [facility] (06/07/14).  Has had persistent tachycardia while at [facility].  [facility] after husbands request for further evaluation of tachycardia.  In ED, UA suggestive of UTI.   STUDIES/SIGNIFICANT EVENTS: 10/12  Debridement of cervical abscess [facility] 12/09  Admitted to [facility] 12/21  [facility]28 Husband definitely wants to take patient home. Care manager says things are being set up. Temp now 102.50F -- ongoing fevers 101-103F. Controlled wit tylenol.  01/06 Labile fevers 01/07 Blood culture from 01/03 positive for ESBL and carbapenem resistant Klebsiella. ID consult. AVYCAZ initiated (ceftaz-avibactam)  STUDIES: 12/28 CT chest: Dense left lower lobe collapse/ consolidation, suggestive of pneumonia. There is patchy less confluent airspace disease in the left upper lobe.  INDWELLING DEVICES:: Chronic trach  RUE PICC (from [facility])  >> 1/03 LUE PICC 1/03 >> 1/12 Mid line PICC 1/12 >>  MICRO DATA: Resp 12/22 >> Serratia (pansens) Blood 12/22 >> 1/2 enterococcus C diff 12/29 >> POSITIVE Blood 01/03 >> 1/2 ESBL, carbapenem-resistent Klebsiella  CURRENT ANTIMICROBIALS:  Dicloxacillin (for chronic MSSA osteo) >> 08/14/14 (planned) Vanc 12/24 >> 01/05 Cipro 12/21 >> 12/26, 01/03 >> 01/07 Enteral vanc 12/28 >> 1/11 Ceftaz 01/04 >> 01/07 Ceftaz-avibactam (AVYCAZ) 01/07 >>   SUBJECTIVE: Husband reports pt can now mover her head independently side to side.  VITAL SIGNS: Temp:  [97.3 F (36.3 C)-98.7 F (37.1 C)] 98.2 F (36.8 C) (01/15 0450) Pulse Rate:  [81-103] 92 (01/15 0450) Resp:  [15-22] 20 (01/15  0450) BP: (96-118)/(60-66) 109/64 mmHg (01/15 0450) SpO2:  [100 %] 100 % (01/15 0450) FiO2 (%):  [30 %] 30 % (01/15 0856)   VENTILATOR SETTINGS: Vent Mode:  [-] SIMV;PSV;Volume support FiO2 (%):  [30 %] 30 % Set Rate:  [15 bmp] 15 bmp Vt Set:  [450 mL] 450 mL PEEP:  [5 cmH20] 5 cmH20 Pressure Support:  [10 cmH20] 10 cmH20 Plateau Pressure:  [20 cmH20-22 cmH20] 21 cmH20   INTAKE / OUTPUT: Intake/Output      01/14 0701 - 01/15 0700 01/15 0701 - 01/16 0700   I.V. (mL/kg) 100 (1.5) 1220 (18.3)   NG/GT 550 165   IV Piggyback 100    Total Intake(mL/kg) 750 (11.3) 1385 (20.8)   Urine (mL/kg/hr) 1025 (0.6)    Stool 0 (0)    Total Output 1025     Net -275 +1385        Stool Occurrence 1 x      PHYSICAL EXAMINATION: General: NAD Neuro: RASS 0. , blinks on command but otherwise non-communicative. HEENT: WNL Cardiovascular: RRR, reg, 3/6 systolic M Lungs: clear anteriorly, unlabored on vent, diffuse rhonchi  Abdomen: soft, non tender, PEG site clean Ext: no edema, no cyanosis   Recent Labs Lab 07/09/14 1216 07/11/14 0422 07/14/14 0500  NA 142 140 142  K 2.9* 3.5 3.0*  CL 105 107 102  CO2 27 28 30   BUN 11 10 8   CREATININE <0.30* <0.30* <0.30*  GLUCOSE 177* 121* 166*    Recent Labs Lab 07/09/14 1216 07/14/14 0500  HGB 9.8* 10.0*  HCT 31.3* 32.2*  WBC 10.8* 9.0  PLT 288 268  CXR No results found.   CBG (last 3)   Recent Labs  07/13/14 0820 07/13/14 1540 07/14/14 0046  GLUCAP 128* 116* 126*     ASSESSMENT / PLAN:  Chronic VDRF; Chronic tracheostomy status. Resolving PNA/ HCAP (serratia).  Plan:   Continue full vent support Continue vent bundle SLP will continue to work w/ augmentative communication  Klebsiella bacteremia - highly resistant (ESBL and carbapenemase producing). Plan:  Avycaz (ceftaz-avibactam) 2.5g IV q8h; per ID >> planned d/c date 07/25/14  C diff Colitis.  Plan: Off therapy  Sepsis Sinus tachycardia, controlled. Chronic  hypotension, controlled on midodrine. Plan:  Continue lopressor and midodrine   Chronic cervical osteomyelitis/diskitis. Plan: On dicloxacillin through 08/14/14  Hypokalemia. Plan F/u and replace as needed  Protein calorie malnutrition.  Plan Continue tubefeeds   Quadriplegic/ physical deconditioning. Plan Continue supportive care PT and speech following Fentanyl patch >> change to 50 mcg on 1/15  Anemia of chronic disease. Plan: F/u CBC intermittently lovenox for DVT prophylaxis  Summary: Husband still hopeful to take her home once she is done with IV abx.  Coralyn Helling, MD [facility] Pulmonary/Critical Care 07/14/2014, 10:41 AM Pager:  [phone] After 3pm call: [phone]

## 2014-07-14 NOTE — Progress Notes (Signed)
Physical Therapy Treatment Patient Details Name: Kathryn Booth MRN: [id number] DOB: 1970/09/17 Today's Date: 07/14/2014    History of Present Illness 44 y.o. 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  Recently admitted to [facility] (06/07/14).  Has had persistent tachycardia while at [facility].  [facility] after husbands request for further evaluation of tachycardia.  In ED, UA suggestive of UTI.    PT Comments    Patient seen for home education and therapeutic activities. Spouse not present during session. Assisted patient with PROM, repositioning and further assessment of skin integrity.  May benefit from OT consult for resting hand splints. Patient initially able to follow commands with eyes, but as we progressed patient demonstrates fatigue with gaze stabilization. No active neck or shoulder ROM noted this session. VSS on vent. Will continue to follow up for caregiver education.   Follow Up Recommendations  Home health PT;Supervision/Assistance - 24 hour (plan is for home dc per husband)     Equipment Recommendations  Hospital bed;Other (comment) (air mattress, hoyer lift, reclining w/c)    Recommendations for Other Services       Precautions / Restrictions Precautions Precautions:  (Vent) Precaution Comments: BP checks with positioning, [id number]:Quadriplegia Restrictions Weight Bearing Restrictions: No Other Position/Activity Restrictions: maintain BP status (autonomic dysreflexia risk)    Mobility  Bed Mobility                  Transfers                    Ambulation/Gait                 Stairs            Wheelchair Mobility    Modified Rankin (Stroke Patients Only)       Balance                                    Cognition Arousal/Alertness: Awake/alert   Overall Cognitive Status: Impaired/Different from baseline                      Exercises      General Comments  General comments (skin integrity, edema, etc.): PROM all extremities, Repositioning for skin protection, attemtped assessment of ability to follow commands, increased fatigue with gaze stabilization.       Pertinent Vitals/Pain Pain Assessment: No/denies pain Faces Pain Scale: No hurt    Home Living                      Prior Function            PT Goals (current goals can now be found in the care plan section) Acute Rehab PT Goals PT Goal Formulation: With family Time For Goal Achievement: 07/26/14 Potential to Achieve Goals: Fair Progress towards PT goals: PT to reassess next treatment    Frequency  Min 1X/week    PT Plan Current plan remains appropriate    Co-evaluation             End of Session Equipment Utilized During Treatment:  (ventilator) Activity Tolerance: Patient limited by fatigue Patient left: in bed;with call bell/phone within reach;with family/visitor present     Time: [id number] PT Time Calculation (min) (ACUTE ONLY): 19 min  Charges:  $Therapeutic Activity: 8-22 mins  G CodesFabio Asa 07/18/2014, 5:14 PM  Charlotte Crumb, PT DPT  [phone]

## 2014-07-15 ENCOUNTER — Inpatient Hospital Stay (HOSPITAL_COMMUNITY): Payer: Medicare Other

## 2014-07-15 LAB — BASIC METABOLIC PANEL
ANION GAP: 4 — AB (ref 5–15)
BUN: 7 mg/dL (ref 6–23)
CO2: 33 mmol/L — ABNORMAL HIGH (ref 19–32)
Calcium: 10 mg/dL (ref 8.4–10.5)
Chloride: 103 mEq/L (ref 96–112)
Creatinine, Ser: <0.3 mg/dL — ABNORMAL LOW (ref 0.50–1.10)
GLUCOSE: 124 mg/dL — AB (ref 70–99)
POTASSIUM: 3.9 mmol/L (ref 3.5–5.1)
Sodium: 140 mmol/L (ref 135–145)

## 2014-07-15 LAB — GLUCOSE, CAPILLARY
Glucose-Capillary: 100 mg/dL — ABNORMAL HIGH (ref 70–99)
Glucose-Capillary: 121 mg/dL — ABNORMAL HIGH (ref 70–99)
Glucose-Capillary: 125 mg/dL — ABNORMAL HIGH (ref 70–99)

## 2014-07-15 LAB — MAGNESIUM: Magnesium: 1.9 mg/dL (ref 1.5–2.5)

## 2014-07-15 MED ORDER — PAROXETINE HCL 20 MG PO TABS
20.0000 mg | ORAL_TABLET | Freq: Every day | ORAL | Status: DC
  Administered 2014-07-15 – 2014-07-26 (×12): 20 mg
  Filled 2014-07-15 (×14): qty 1

## 2014-07-15 NOTE — Progress Notes (Signed)
PULMONARY / CRITICAL CARE MEDICINE   Name: Kathryn Booth MRN: [id number] DOB: [birth year]    ADMISSION DATE:  06/19/2014 CONSULTATION DATE:  07/15/2014  [facility]ING MD :  [facility]  CHIEF COMPLAINT:  Tachycardia  INITIAL PRESENTATION:   44 y.o. 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  Recently admitted to [facility] (06/07/14).  Has had persistent tachycardia while at [facility].  Brought to [facility] ED 12/21 after husbands request for further evaluation of tachycardia.  In ED, UA suggestive of UTI.   STUDIES/SIGNIFICANT EVENTS: 10/12  Debridement of cervical abscess [facility]) 12/09  Admitted to [facility] 12/21  Admitted to [facility] with hx as above 12/28 Husband definitely wants to take patient home. Care manager says things are being set up. Temp now 102.85F -- ongoing fevers 101-103F. Controlled wit tylenol.  01/06 Labile fevers 01/07 Blood culture from 01/03 positive for ESBL and carbapenem resistant Klebsiella. ID consult. AVYCAZ initiated (ceftaz-avibactam)  STUDIES: 12/28 CT chest: Dense left lower lobe collapse/ consolidation, suggestive of pneumonia. There is patchy less confluent airspace disease in the left upper lobe.  INDWELLING DEVICES:: Chronic trach  RUE PICC (from [facility])  >> 1/03 LUE PICC 1/03 >> 1/12 Mid line PICC 1/12 >>  MICRO DATA: Resp 12/22 >> Serratia (pansens) Blood 12/22 >> 1/2 enterococcus C diff 12/29 >> POSITIVE Blood 01/03 >> 1/2 ESBL, carbapenem-resistent Klebsiella  CURRENT ANTIMICROBIALS:  Dicloxacillin (for chronic MSSA osteo) >> 08/14/14 (planned) Vanc 12/24 >> 01/05 Cipro 12/21 >> 12/26, 01/03 >> 01/07 Enteral vanc 12/28 >> 1/11 Ceftaz 01/04 >> 01/07 Ceftaz-avibactam (AVYCAZ) 01/07 >>   SUBJECTIVE: No change over night   VITAL SIGNS: Temp:  [98.2 F (36.8 C)-98.6 F (37 C)] 98.6 F (37 C) (01/16 0815) Pulse Rate:  [84-105] 95 (01/16 0815) Resp:  [15-24] 23 (01/16 0815) BP: (90-111)/(58-68) 92/58 mmHg (01/16 0815) SpO2:   [98 %-100 %] 100 % (01/16 0815) FiO2 (%):  [30 %] 30 % (01/16 0739) Weight:  [63.7 kg (140 lb 6.9 oz)] 63.7 kg (140 lb 6.9 oz) (01/16 0545)   VENTILATOR SETTINGS: Vent Mode:  [-] SIMV FiO2 (%):  [30 %] 30 % Set Rate:  [15 bmp] 15 bmp Vt Set:  [450 mL] 450 mL PEEP:  [5 cmH20] 5 cmH20 Pressure Support:  [10 cmH20] 10 cmH20 Plateau Pressure:  [19 cmH20-24 cmH20] 23 cmH20   INTAKE / OUTPUT: Intake/Output      01/15 0701 - 01/16 0700 01/16 0701 - 01/17 0700   I.V. (mL/kg) 1435 (22.5)    NG/GT 1375    IV Piggyback 50    Total Intake(mL/kg) 2860 (44.9)    Urine (mL/kg/hr) 2250 (1.5)    Stool     Total Output 2250     Net +610            PHYSICAL EXAMINATION: General: NAD Neuro: RASS 0. , blinks on command but otherwise non-communicative. HEENT: WNL Cardiovascular: RRR, reg, 3/6 systolic M Lungs: clear anteriorly, unlabored on vent, diffuse rhonchi  Abdomen: soft, non tender, PEG site clean Ext: no edema, no cyanosis   Recent Labs Lab 07/11/14 0422 07/14/14 0500 07/15/14 0500  NA 140 142 140  K 3.5 3.0* 3.9  CL 107 102 103  CO2 28 30 33*  BUN CREATININE <0.30* <0.30* <0.30*  GLUCOSE 121* 166* 124*    Recent Labs Lab 07/09/14 1216 07/14/14 0500  HGB 9.8* 10.0*  HCT 31.3* 32.2*  WBC 10.8* 9.0  PLT 288 268  CXR No results found.   CBG (last 3)   Recent Labs  07/14/14 1819 07/14/14 2346 07/15/14 0825  GLUCAP 97 121* 100*     ASSESSMENT / PLAN:  Chronic VDRF; Chronic tracheostomy status. Resolving PNA/ HCAP (serratia).  Plan:   Continue full vent support Continue vent bundle SLP will continue to work w/ augmentative communication  Klebsiella bacteremia - highly resistant (ESBL and carbapenemase producing). Plan:  Avycaz (ceftaz-avibactam) 2.5g IV q8h; per ID >> planned d/c date 07/25/14  C diff Colitis.  Plan: Off therapy Insure H2 blocker and avoid PPI  Sepsis Sinus tachycardia, controlled. Chronic hypotension, controlled on  midodrine. Plan:  Continue lopressor and midodrine   Chronic cervical osteomyelitis/diskitis. Plan: On dicloxacillin through 08/14/14  Hypokalemia. Plan F/u and replace as needed  Protein calorie malnutrition.  Plan Continue tubefeeds   Quadriplegic/ physical deconditioning. Hx of anxiety/ depression Plan Continue supportive care PT and speech following Fentanyl patch >> changed to 50 mcg on 1/15  Anemia of chronic disease. Plan: F/u CBC intermittently lovenox for DVT prophylaxis  Summary: Poss home d/c after ABX IV completed 07/25/14  Dorcas CarrowPatrick WrightMD [facility] 07/15/2014, 9:12 AM Beeper  [phone]  Cell  [phone]  If no response or cell goes to voicemail, call beeper [phone]

## 2014-07-16 LAB — GLUCOSE, CAPILLARY
GLUCOSE-CAPILLARY: 153 mg/dL — AB (ref 70–99)
GLUCOSE-CAPILLARY: 104 mg/dL — AB (ref 70–99)
Glucose-Capillary: 130 mg/dL — ABNORMAL HIGH (ref 70–99)

## 2014-07-16 MED ORDER — NYSTATIN 100000 UNIT/ML MT SUSP
5.0000 mL | Freq: Four times a day (QID) | OROMUCOSAL | Status: DC
  Administered 2014-07-16: 500000 [IU] via OROMUCOSAL
  Filled 2014-07-16 (×5): qty 5

## 2014-07-16 NOTE — Progress Notes (Signed)
Examined patient bedside, does appear to have mild thrush on the tongue, nothing on the back of the throat or buccal mucosa. Will order nystatin QID to be applied on the tongue, communicated with patient's nurse

## 2014-07-16 NOTE — Progress Notes (Signed)
PULMONARY / CRITICAL CARE MEDICINE   Name: Kathryn Booth MRN: [id number] DOB: [birth year]    ADMISSION DATE:  06/19/2014 CONSULTATION DATE:  07/16/2014  REFERRING MD :  [facility]  CHIEF COMPLAINT:  Tachycardia  INITIAL PRESENTATION:   44 y.o. 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  Recently admitted to [facility] (06/07/14).  Has had persistent tachycardia while at [facility].  [facility] after husbands request for further evaluation of tachycardia.  In ED, UA suggestive of UTI.   STUDIES/SIGNIFICANT EVENTS: 10/12  Debridement of cervical abscess [facility] 12/09  Admitted to [facility] 12/21  [facility] as [facility]/28 Husband definitely wants to take patient home. Care manager says things are being set up. Temp now 102.84F -- ongoing fevers 101-103F. Controlled wit tylenol.  01/06 Labile fevers 01/07 Blood culture from 01/03 positive for ESBL and carbapenem resistant Klebsiella. ID consult. AVYCAZ initiated (ceftaz-avibactam)  STUDIES: 12/28 CT chest: Dense left lower lobe collapse/ consolidation, suggestive of pneumonia. There is patchy less confluent airspace disease in the left upper lobe.  INDWELLING DEVICES:: Chronic trach  RUE PICC (from [facility])  >> 1/03 LUE PICC 1/03 >> 1/12 Mid line PICC 1/12 >>  MICRO DATA: Resp 12/22 >> Serratia (pansens) Blood 12/22 >> 1/2 enterococcus C diff 12/29 >> POSITIVE Blood 01/03 >> 1/2 ESBL, carbapenem-resistent Klebsiella  CURRENT ANTIMICROBIALS:  Dicloxacillin (for chronic MSSA osteo) >> 08/14/14 (planned) Vanc 12/24 >> 01/05 Cipro 12/21 >> 12/26, 01/03 >> 01/07 Enteral vanc 12/28 >> 1/11 Ceftaz 01/04 >> 01/07 Ceftaz-avibactam (AVYCAZ) 01/07 >>   SUBJECTIVE: No new issues  VITAL SIGNS: Temp:  [97.9 F (36.6 C)-99 F (37.2 C)] 99 F (37.2 C) (01/17 0841) Pulse Rate:  [81-103] 103 (01/17 0841) Resp:  [15-24] 22 (01/17 0841) BP: (90-106)/(57-72) 103/60 mmHg (01/17 0841) SpO2:  [100  %] 100 % (01/17 0841) FiO2 (%):  [30 %] 30 % (01/17 0841)   VENTILATOR SETTINGS: Vent Mode:  [-] SIMV;PSV FiO2 (%):  [30 %] 30 % Set Rate:  [15 bmp] 15 bmp Vt Set:  [450 mL] 450 mL PEEP:  [5 cmH20] 5 cmH20 Pressure Support:  [10 cmH20] 10 cmH20 Plateau Pressure:  [13 cmH20-22 cmH20] 22 cmH20   INTAKE / OUTPUT: Intake/Output      01/16 0701 - 01/17 0700 01/17 0701 - 01/18 0700   P.O. 0 0   I.V. (mL/kg) 140 (2.2)    Other 410    NG/GT 1370    IV Piggyback 150    Total Intake(mL/kg) 2070 (32.5) 0 (0)   Urine (mL/kg/hr) 1250 (0.8)    Total Output 1250     Net +820 0          PHYSICAL EXAMINATION: General: NAD Neuro: RASS 0. , blinks on command but otherwise non-communicative. HEENT: WNL Cardiovascular: RRR, reg, 3/6 systolic M Lungs: clear anteriorly, unlabored on vent, diffuse rhonchi  Abdomen: soft, non tender, PEG site clean Ext: no edema, no cyanosis   Recent Labs Lab 07/11/14 0422 07/14/14 0500 07/15/14 0500  NA 140 142 140  K 3.5 3.0* 3.9  CL 107 102 103  CO2 28 30 33*  BUN 10 8 7   CREATININE <0.30* <0.30* <0.30*  GLUCOSE 121* 166* 124*    Recent Labs Lab 07/09/14 1216 07/14/14 0500  HGB 9.8* 10.0*  HCT 31.3* 32.2*  WBC 10.8* 9.0  PLT 288 268   CXR Dg Chest Port 1 View  07/15/2014   CLINICAL DATA:  Healthcare  acquired pneumonia, hypotension  EXAM: PORTABLE CHEST - 1 VIEW  COMPARISON:  Prior chest x-ray 07/09/2014  FINDINGS: Tracheostomy tube remains in good position with the tip midline and at the level of the clavicles. A right upper extremity PICC is a present. The tip overlies the superior cavoatrial junction. The previously identified left upper extremity PICC has been removed. A balloon expandable metal stent is identified in the left main pulmonary artery. This is been present over multiple prior studies.  Stable mild enlargement of the cardiopericardial silhouette. Mild pulmonary vascular congestion without overt edema slightly increased compared  to prior. Nonspecific bibasilar retrocardiac opacities favored to reflect atelectasis. No pneumothorax or other new acute abnormality. No acute osseous abnormality.  IMPRESSION: 1. Slight interval increase in pulmonary vascular congestion but no overt edema. 2. Interval removal of left upper extremity PICC and placement of a right upper extremity PICC. The catheter tip overlies the superior cavoatrial junction. 3. Stable position of tracheostomy tube. 4. Nonspecific bibasilar retrocardiac opacities are favored to reflect atelectasis.   Electronically Signed   By: Malachy Moan M.D.   On: 07/15/2014 09:13     CBG (last 3)   Recent Labs  07/15/14 1605 07/16/14 0008 07/16/14 0840  GLUCAP 125* 130* 153*     ASSESSMENT / PLAN:  Chronic VDRF; Chronic tracheostomy status. Resolving PNA/ HCAP (serratia).  Plan:   Continue full vent support Continue vent bundle SLP will continue to work w/ augmentative communication  Klebsiella bacteremia - highly resistant (ESBL and carbapenemase producing). Plan:  Avycaz (ceftaz-avibactam) 2.5g IV q8h; per ID >> planned d/c date 07/25/14  C diff Colitis.  Plan: Off therapy Insure H2 blocker and avoid PPI  Sepsis Sinus tachycardia, controlled. Chronic hypotension, controlled on midodrine. Plan:  Continue lopressor and midodrine   Chronic cervical osteomyelitis/diskitis. Plan: On dicloxacillin through 08/14/14  Hypokalemia. Plan F/u and replace as needed  Protein calorie malnutrition.  Plan Continue tubefeeds   Quadriplegic/ physical deconditioning. Hx of anxiety/ depression Plan Continue supportive care PT and speech following Fentanyl patch >> changed to 50 mcg on 1/15  Anemia of chronic disease. Plan: F/u CBC intermittently lovenox for DVT prophylaxis  Summary: Poss home d/c after ABX IV completed 07/25/14  Dorcas Carrow [facility] Pulmonary/Critical Care 07/16/2014, 10:20 AM Beeper  [phone]  Cell   [phone]  If no response or cell goes to voicemail, call beeper [phone]

## 2014-07-17 LAB — GLUCOSE, CAPILLARY
GLUCOSE-CAPILLARY: 141 mg/dL — AB (ref 70–99)
GLUCOSE-CAPILLARY: 150 mg/dL — AB (ref 70–99)
Glucose-Capillary: 121 mg/dL — ABNORMAL HIGH (ref 70–99)

## 2014-07-17 MED ORDER — VANCOMYCIN 50 MG/ML ORAL SOLUTION
250.0000 mg | Freq: Four times a day (QID) | ORAL | Status: DC
  Administered 2014-07-17 – 2014-07-26 (×38): 250 mg via ORAL
  Filled 2014-07-17 (×41): qty 5

## 2014-07-17 MED ORDER — FLUCONAZOLE 100MG IVPB
100.0000 mg | INTRAVENOUS | Status: AC
  Administered 2014-07-17 – 2014-07-19 (×3): 100 mg via INTRAVENOUS
  Filled 2014-07-17 (×3): qty 50

## 2014-07-17 NOTE — Progress Notes (Signed)
Speech Language Pathology Treatment: Cognitive-Linquistic  Patient Details Name: Kathryn Booth MRN: [id number] DOB: 15-Jan-1971 Today's Date: 07/17/2014 Time: [id number] SLP Time Calculation (min) (ACUTE ONLY): 25 min  Assessment / Plan / Recommendation Clinical Impression  Continued f/u for communication: Pt requiring max multimodal cues today for participation.  Yes/no signs posted on wall at foot of bed.  Pt eye-scanned to appropriate target word 70% of trials in response to factual yes/no questions.  Level of alertness waxing and waning today.  Eye scan board utilized for spelling of words to dictation - pt with difficulty scanning to target letter today.  Will continue to follow to maximize functional communication.    HPI HPI: 44 y.o. 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 Chronic trach and vent. Chronic Encephalopathy: etiology unclear-> hyperthermia vs hypoperfusion vs ongoing infection. Has had EEGs that are NOT c/w being locked in.  Recently admitted to [facility] (06/07/14). Has had persistent tachycardia while at [facility]. [facility] after husbands request for further evaluation of tachycardia. 01/07 Blood culture from 01/03 positive for ESBL and carbapenem resistant Klebsiella. ID consult. AVYCAZ initiated (ceftaz-avibactam).     Pertinent Vitals Pain Assessment: Faces Faces Pain Scale: No hurt  SLP Plan  Continue with current plan of care    Recommendations              Plan: Continue with current plan of care   Kathryn Booth, [place] CCC/SLP Pager [phone]      Kathryn Booth, Kathryn Booth 07/17/2014, 11:02 AM

## 2014-07-17 NOTE — Progress Notes (Signed)
NUTRITION FOLLOW UP  Intervention:    Continue TF via PEG, Jevity 1.2 at 55 ml/h with Prostat 30 ml once daily, providing 1684 kcals, 88 gm protein, 1069 ml free water daily.   Nutrition Dx:   Inadequate oral intake related to inability to eat as evidenced by NPO status. Ongoing.  Goal:   Intake to meet >90% of estimated nutrition needs. Met.  Monitor:   TF tolerance/adequacy, weight trend, labs, vent status.  Assessment:   44 y.o. 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 Recently admitted to [place] (06/07/14). Has had persistent tachycardia while at [facility]. Brought to [facility] ED 12/21 after husbands request for further evaluation of tachycardia. In ED, UA suggestive of UTI.  Patient has PEG in place with Jevity 1.2 infusing @ 55 ml/hr. 30 ml Prostat via tube once daily. Tube feeding regimen currently providing 1684 kcal, 88 grams protein, and 1069 ml H2O.   Residuals: 10 ccs Last bm: 1/17; per RN it is soft but not diarrhea Labs: Phos elevated, Na WNL, K WNL, Mg WNL  Patient with chronic trach on ventilator support MV: 8.3 L/min Temp (24hrs), Avg:98.4 F (36.9 C), Min:97.1 F (36.2 C), Max:98.9 F (37.2 C)  Propofol: none  Height: Ht Readings from Last 1 Encounters:  06/28/14 5' (1.524 m)    Weight Status:   Wt Readings from Last 1 Encounters:  07/17/14 137 lb 9.1 oz (62.4 kg)  07/03/14 143 lb 06/26/14 136 lb 3.9 oz (61.8 kg)   06/20/14 138 lb 10.7 oz (62.9 kg)    Re-estimated needs:  Kcal: 1419 Protein: 85-100 gm Fluid: 1.6-1.8 L  Skin: stage 2 pressure ulcer on coccyx  Diet Order:     Intake/Output Summary (Last 24 hours) at 07/17/14 1345 Last data filed at 07/17/14 0900  Gross per 24 hour  Intake   1595 ml  Output   2051 ml  Net   -456 ml    Last BM: 1/17  Labs:   Recent Labs Lab 07/11/14 0422 07/14/14 0500 07/15/14 0500  NA 140 142 140  K 3.5 3.0* 3.9  CL 107 102 103  CO2 28 30 33*  BUN _0 CREATININE <0.30* <0.30* <0.30*  CALCIUM 9.6 9.6 10.0  MG  --  1.9 1.9  PHOS  --  5.2*  --   GLUCOSE 121* 166* 124*    CBG (last 3)   Recent Labs  07/16/14 1708 07/17/14 0024 07/17/14 0845  GLUCAP 104* 141* 121*    Scheduled Meds: . antiseptic oral rinse  7 mL Mouth Rinse QID  . ceftazidime avibactam (AVYCAZ) IVPB  2.5 g Intravenous 3 times per day  . chlorhexidine  15 mL Mouth Rinse BID  . dicloxacillin  500 mg Per Tube 3 times per day  . enoxaparin (LOVENOX) injection  40 mg Subcutaneous Q24H  . famotidine  20 mg Per Tube Daily  . feeding supplement (PRO-STAT SUGAR FREE 64)  30 mL Per Tube Q1500  . fentaNYL  50 mcg Transdermal Q72H  . fluconazole (DIFLUCAN) IV  100 mg Intravenous Q24H  . metoprolol tartrate  50 mg Per Tube BID  . midodrine  5 mg Per Tube TID WC  . PARoxetine  20 mg Per Tube Daily  . sodium chloride  10-40 mL Intracatheter Q12H    Continuous Infusions: . sodium chloride 10 mL/hr at 07/14/14 0800  . feeding supplement (JEVITY 1.2 CAL) 1,000 mL (07/17/14 0200)    Laurette Schimke MS,  RD, LDN

## 2014-07-17 NOTE — Progress Notes (Addendum)
Gave patient Fentanyl at his request  of husband for pain.

## 2014-07-17 NOTE — Progress Notes (Signed)
PULMONARY / CRITICAL CARE MEDICINE   Name: Kathryn Booth MRN: [id number] DOB: March 14, 1971    ADMISSION DATE:  06/19/2014 CONSULTATION DATE:  07/17/2014  [facility]ERRING MD :  [facility]  CHIEF COMPLAINT:  Tachycardia  INITIAL PRESENTATION:   44 y.o. 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  Recently admitted to [facility] (06/07/14).  Has had persistent tachycardia while at [facility].  Brought to [facility] ED 12/21 after husbands request for further evaluation of tachycardia.  In ED, UA suggestive of UTI.   STUDIES/SIGNIFICANT EVENTS: 10/12  Debridement of cervical abscess [facility]) 12/09  Admitted to [facility] 12/21  Admitted to [facility] with hx as above 12/28 Husband definitely wants to take patient home. Care manager says things are being set up. Temp now 102.69F -- ongoing fevers 101-103F. Controlled wit tylenol.  01/06 Labile fevers 01/07 Blood culture from 01/03 positive for ESBL and carbapenem resistant Klebsiella. ID consult. AVYCAZ initiated (ceftaz-avibactam)  STUDIES: 12/28 CT chest: Dense left lower lobe collapse/ consolidation, suggestive of pneumonia. There is patchy less confluent airspace disease in the left upper lobe.  INDWELLING DEVICES:: Chronic trach  RUE PICC (from [facility])  >> 1/03 LUE PICC 1/03 >> 1/12 Mid line PICC 1/12 >>  MICRO DATA: Resp 12/22 >> Serratia (pansens) Blood 12/22 >> 1/2 enterococcus C diff 12/29 >> POSITIVE Blood 01/03 >> 1/2 ESBL, carbapenem-resistent Klebsiella Blood 1/08 >>  PICC Cath tip 1/12 >>   CURRENT ANTIMICROBIALS:  Dicloxacillin (for chronic MSSA osteo) >> 08/14/14 (planned) Vanc 12/24 >> 01/05 Cipro 12/21 >> 12/26, 01/03 >> 01/07 Enteral vanc 12/28 >> 1/11 Ceftaz 01/04 >> 01/07 Ceftaz-avibactam (AVYCAZ) 01/07 >>   SUBJECTIVE: No new issues  VITAL SIGNS: Temp:  [98.1 F (36.7 C)-99.8 F (37.7 C)] 98.1 F (36.7 C) (01/18 0800) Pulse Rate:  [74-102] 89 (01/18 0807) Resp:  [15-29] 25 (01/18 0807) BP:  (98-120)/(58-73) 120/73 mmHg (01/18 0800) SpO2:  [100 %] 100 % (01/18 0807) FiO2 (%):  [30 %] 30 % (01/18 0807) Weight:  [62.4 kg (137 lb 9.1 oz)] 62.4 kg (137 lb 9.1 oz) (01/18 0500)   VENTILATOR SETTINGS: Vent Mode:  [-] SIMV FiO2 (%):  [30 %] 30 % Set Rate:  [15 bmp] 15 bmp Vt Set:  [450 mL] 450 mL PEEP:  [5 cmH20] 5 cmH20 Pressure Support:  [10 cmH20] 10 cmH20 Plateau Pressure:  [22 cmH20-24 cmH20] 23 cmH20   INTAKE / OUTPUT: Intake/Output      01/17 0701 - 01/18 0700 01/18 0701 - 01/19 0700   P.O. 0    I.V. (mL/kg) 180 (2.9)    Other 370    NG/GT 1265    IV Piggyback 150    Total Intake(mL/kg) 1965 (31.5)    Urine (mL/kg/hr) 1825 (1.2)    Drains 225 (0.2)    Stool  1 (0)   Total Output 2050 1   Net -85 -1          PHYSICAL EXAMINATION: General: NAD Neuro: RASS 0. , blinks on command but otherwise non-communicative. HEENT: WNL Cardiovascular: RRR, reg, 3/6 systolic M Lungs: clear anteriorly, unlabored on vent, diffuse rhonchi  Abdomen: soft, non tender, PEG site clean Ext: no edema, no cyanosis   Recent Labs Lab 07/11/14 0422 07/14/14 0500 07/15/14 0500  NA 140 142 140  K 3.5 3.0* 3.9  CL 107 102 103  CO2 28 30 33*  BUN CREATININE <0.30* <0.30* <0.30*  GLUCOSE 121* 166* 124*    Recent Labs Lab  07/14/14 0500  HGB 10.0*  HCT 32.2*  WBC 9.0  PLT 268   CXR No results found.   CBG (last 3)   Recent Labs  07/16/14 1708 07/17/14 0024 07/17/14 0845  GLUCAP 104* 141* 121*     ASSESSMENT / PLAN:  Chronic VDRF; Chronic tracheostomy status. Resolving PNA/ HCAP (serratia).  Plan:   Continue full vent support Continue vent bundle SLP will continue to work w/ augmentative communication  Klebsiella bacteremia - highly resistant (ESBL and carbapenemase producing). Plan:  Avycaz (ceftaz-avibactam) 2.5g IV q8h; per ID >> planned d/c date 07/25/14  C diff Colitis.  Plan: Off therapy Insure H2 blocker and avoid PPI  Thrush noted  1/17 pm Plan:  Not going to tolerate Nystatin PO Will change to diflucan x 3 days, first day 1/18  Sepsis Sinus tachycardia, controlled. Chronic hypotension, controlled on midodrine. Plan:  Continue lopressor and midodrine   Chronic cervical osteomyelitis/diskitis. Plan: On dicloxacillin through 08/14/14  Hypokalemia. Plan F/u and replace as needed  Protein calorie malnutrition.  Plan Continue tubefeeds   Quadriplegic/ physical deconditioning. Hx of anxiety/ depression Plan Continue supportive care PT and speech following Fentanyl patch >> changed to 50 mcg on 1/15  Anemia of chronic disease. Plan: F/u CBC intermittently lovenox for DVT prophylaxis  Summary: Poss home d/c after ABX IV completed 07/25/14   Levy Pupaobert Byrum, MD, PhD 07/17/2014, 10:31 AM [place] [facility] and [facility] [phone] or if no answer [phone]

## 2014-07-18 LAB — GLUCOSE, CAPILLARY
GLUCOSE-CAPILLARY: 116 mg/dL — AB (ref 70–99)
GLUCOSE-CAPILLARY: 119 mg/dL — AB (ref 70–99)
Glucose-Capillary: 115 mg/dL — ABNORMAL HIGH (ref 70–99)

## 2014-07-18 NOTE — Clinical Social Work Note (Signed)
CSW received call via CSW Dep from [name] (pt husband) requesting additional food vouchers.  Patient had been given 8 food vouchers last week.  CSW spoke with supervisor who approved 8 more food vouchers- CSW explained to pt husband that this is the last of the vouchers we are able to supply and will need to last him through 1/26 when pt is able to DC home.  CSW signing off.  Merlyn LotJenna Holoman, LCSWA Clinical Social Worker [phone]

## 2014-07-18 NOTE — Progress Notes (Signed)
PULMONARY / CRITICAL CARE MEDICINE   Name: Domingo SepDana Lashon Zundel MRN: [id number] DOB: 1971-06-19    ADMISSION DATE:  06/19/2014 CONSULTATION DATE:  07/18/2014  [facility]ING MD :  [facility]  CHIEF COMPLAINT:  Tachycardia  INITIAL PRESENTATION:   44 y.o. 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  Recently admitted to [facility] (06/07/14).  Has had persistent tachycardia while at [facility].  [facility] after husbands request for further evaluation of tachycardia.  In ED, UA suggestive of UTI.   STUDIES/SIGNIFICANT EVENTS: 10/12  Debridement of cervical abscess [facility] 12/09  Admitted to [facility] 12/21  [facility] 12/28 Husband definitely wants to take patient home. Care manager says things are being set up. Temp now 102.5F -- ongoing fevers 101-103F. Controlled wit tylenol.  01/06 Labile fevers 01/07 Blood culture from 01/03 positive for ESBL and carbapenem resistant Klebsiella. ID consult. AVYCAZ initiated (ceftaz-avibactam)  STUDIES: 12/28 CT chest: Dense left lower lobe collapse/ consolidation, suggestive of pneumonia. There is patchy less confluent airspace disease in the left upper lobe.  INDWELLING DEVICES:: Chronic trach  RUE PICC (from [facility])  >> 1/03 LUE PICC 1/03 >> 1/12 Mid line PICC 1/12 >>  MICRO DATA: Resp 12/22 >> Serratia (pansens) Blood 12/22 >> 1/2 enterococcus C diff 12/29 >> POSITIVE Blood 01/03 >> 1/2 ESBL, carbapenem-resistent Klebsiella Blood 1/08 >>  PICC Cath tip 1/12 >>   CURRENT ANTIMICROBIALS:  Dicloxacillin (for chronic MSSA osteo) >> 08/14/14 (planned) Vanc 12/24 >> 01/05 Cipro 12/21 >> 12/26, 01/03 >> 01/07 Enteral vanc 12/28 >> 1/11 Ceftaz 01/04 >> 01/07 Ceftaz-avibactam (AVYCAZ) 01/07 >>   SUBJECTIVE: No acute events overnight. Husband is requesting an air mattress overlay for patient bed.  VITAL SIGNS: Temp:  [97.1 F (36.2 C)-98.8 F (37.1 C)] 98.6 F (37 C) (01/19 0744) Pulse Rate:   [76-92] 86 (01/19 1007) Resp:  [15-22] 17 (01/19 0744) BP: (99-120)/(55-78) 105/67 mmHg (01/19 1007) SpO2:  [100 %] 100 % (01/19 0744) FiO2 (%):  [30 %] 30 % (01/19 0744) Weight:  [64.32 kg (141 lb 12.8 oz)] 64.32 kg (141 lb 12.8 oz) (01/19 0622)   VENTILATOR SETTINGS: Vent Mode:  [-] SIMV FiO2 (%):  [30 %] 30 % Set Rate:  [15 bmp] 15 bmp Vt Set:  [450 mL] 450 mL PEEP:  [5 cmH20] 5 cmH20 Pressure Support:  [10 cmH20] 10 cmH20 Plateau Pressure:  [20 cmH20-24 cmH20] 21 cmH20   INTAKE / OUTPUT: Intake/Output      01/18 0701 - 01/19 0700 01/19 0701 - 01/20 0700   P.O.     I.V. (mL/kg)     Other     NG/GT     IV Piggyback 150    Total Intake(mL/kg) 150 (2.3)    Urine (mL/kg/hr) 975 (0.6) 350 (1.6)   Drains     Stool 1 (0)    Total Output 976 350   Net -826 -350        Stool Occurrence 1 x      PHYSICAL EXAMINATION: General: NAD Neuro: RASS 0. , blinks on command but otherwise non-communicative. HEENT: WNL, trach in place Cardiovascular: RRR, reg, 3/6 systolic M Lungs: clear anteriorly, unlabored on vent  Abdomen: soft, non tender, PEG site clean, normoactive BS Ext: no edema, no cyanosis   Recent Labs Lab 07/14/14 0500 07/15/14 0500  NA 142 140  K 3.0* 3.9  CL 102 103  CO2 30 33*  BUN 8 7  CREATININE <0.30* <  0.30*  GLUCOSE 166* 124*    Recent Labs Lab 07/14/14 0500  HGB 10.0*  HCT 32.2*  WBC 9.0  PLT 268   CXR No results found.   CBG (last 3)   Recent Labs  07/17/14 1717 07/18/14 0629 07/18/14 0801  GLUCAP 150* 115* 116*     ASSESSMENT / PLAN:  Chronic VDRF; Chronic tracheostomy status. Resolving PNA/ HCAP (serratia).  Plan:   Continue full vent support Continue vent bundle SLP will continue to work w/ augmentative communication  Klebsiella bacteremia - highly resistant (ESBL and carbapenemase producing). Plan:  Avycaz (ceftaz-avibactam) 2.5g IV q8h; per ID >> planned d/c date 07/25/14 Follow WBC intermittently and fever curve  C  diff Colitis.  Plan: Off therapy Insure H2 blocker (Pepcid) and avoid PPI   Thrush noted 1/17 pm Plan:  Not going to tolerate Nystatin PO Will change to diflucan x 3 days, first day 1/18  Sepsis Sinus tachycardia, controlled. Chronic hypotension, controlled on midodrine. Plan:  Continue lopressor and midodrine   Chronic cervical osteomyelitis/diskitis. Plan: On dicloxacillin through 08/14/14  Hypokalemia. Plan F/u and replace as needed  Protein calorie malnutrition.  Plan Continue tubefeeds   Quadriplegic/ physical deconditioning. Hx of anxiety/ depression Plan Continue supportive care PT and speech following Fentanyl patch >> changed to 50 mcg on 1/15  Anemia of chronic disease. Plan: F/u CBC intermittently lovenox for DVT prophylaxis  Summary: Poss home d/c after ABX IV completed 07/25/14   [name], AGACNP-BC [place] [facility] Pager [phone] or [phone]  Quad post cervical spine abscess surgery, highly resistant klebsiella PNA.  Will need IV abx until 1/26 then may go home.  CSW working with family regarding home equipment needed.  Appears well no vent.  Not weaning.  Patient seen and examined, agree with above note.  I dictated the care and orders written for this patient under my direction.  Alyson Reedy, MD [phone]

## 2014-07-18 NOTE — Progress Notes (Signed)
Dr. Molli KnockYacoub advised of whitish drainage from rt eye and will see in the am  on rounds

## 2014-07-18 NOTE — Progress Notes (Signed)
Husband at bedside. Not wearing PPE.

## 2014-07-19 LAB — CBC
HEMATOCRIT: 31.9 % — AB (ref 36.0–46.0)
Hemoglobin: 10 g/dL — ABNORMAL LOW (ref 12.0–15.0)
MCH: 30 pg (ref 26.0–34.0)
MCHC: 31.3 g/dL (ref 30.0–36.0)
MCV: 95.8 fL (ref 78.0–100.0)
Platelets: 237 10*3/uL (ref 150–400)
RBC: 3.33 MIL/uL — AB (ref 3.87–5.11)
RDW: 13.8 % (ref 11.5–15.5)
WBC: 9.9 10*3/uL (ref 4.0–10.5)

## 2014-07-19 LAB — GLUCOSE, CAPILLARY
GLUCOSE-CAPILLARY: 148 mg/dL — AB (ref 70–99)
Glucose-Capillary: 137 mg/dL — ABNORMAL HIGH (ref 70–99)
Glucose-Capillary: 98 mg/dL (ref 70–99)

## 2014-07-19 LAB — BASIC METABOLIC PANEL
ANION GAP: 11 (ref 5–15)
BUN: 9 mg/dL (ref 6–23)
CO2: 29 mmol/L (ref 19–32)
Calcium: 9.9 mg/dL (ref 8.4–10.5)
Chloride: 102 mEq/L (ref 96–112)
Creatinine, Ser: <0.3 mg/dL — ABNORMAL LOW (ref 0.50–1.10)
GLUCOSE: 130 mg/dL — AB (ref 70–99)
Potassium: 3.3 mmol/L — ABNORMAL LOW (ref 3.5–5.1)
Sodium: 142 mmol/L (ref 135–145)

## 2014-07-19 MED ORDER — POTASSIUM CHLORIDE 20 MEQ/15ML (10%) PO SOLN
40.0000 meq | Freq: Once | ORAL | Status: AC
  Administered 2014-07-19: 40 meq
  Filled 2014-07-19: qty 30

## 2014-07-19 NOTE — Progress Notes (Signed)
SLP Cancellation Note  Patient Details Name: Kathryn Booth MRN: [id number] DOB: [birth year]   Cancelled treatment:       Reason Eval/Treat Not Completed: Fatigue/lethargy limiting ability to participate - attempted x 2 to see pt for augmentative communication.  Pt not arousable.    [name], Jakari Sada Laurice 07/19/2014, 10:02 AM

## 2014-07-19 NOTE — Progress Notes (Signed)
PULMONARY / CRITICAL CARE MEDICINE   Name: Kathryn Booth MRN: [id number] DOB: [birth year]    ADMISSION DATE:  06/19/2014 CONSULTATION DATE:  07/19/2014  REFERRING MD :  [facility]  CHIEF COMPLAINT:  Tachycardia  INITIAL PRESENTATION:   44 y.o. 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  Recently admitted to [facility] (06/07/14).  Has had persistent tachycardia while at [facility].  Brought to [facility] ED 12/21 after husbands request for further evaluation of tachycardia.  In ED, UA suggestive of UTI.   STUDIES/SIGNIFICANT EVENTS: 10/12  Debridement of cervical abscess [facility]) 12/09  Admitted to [facility] 12/21  Admitted to [facility] with hx as above 12/28 Husband definitely wants to take patient home. Care manager says things are being set up. Temp now 102.58F -- ongoing fevers 101-103F. Controlled wit tylenol.  01/06 Labile fevers 01/07 Blood culture from 01/03 positive for ESBL and carbapenem resistant Klebsiella. ID consult. AVYCAZ initiated (ceftaz-avibactam)  STUDIES: 12/28 CT chest: Dense left lower lobe collapse/ consolidation, suggestive of pneumonia. There is patchy less confluent airspace disease in the left upper lobe.  INDWELLING DEVICES:: Chronic trach  RUE PICC (from [facility])  >> 1/03 LUE PICC 1/03 >> 1/12 Mid line PICC 1/12 >>  MICRO DATA: Resp 12/22 >> Serratia (pansens) Blood 12/22 >> 1/2 enterococcus C diff 12/29 >> POSITIVE Blood 01/03 >> 1/2 ESBL, carbapenem-resistent Klebsiella Blood 1/08 >>  PICC Cath tip 1/12 >>   CURRENT ANTIMICROBIALS:  Dicloxacillin (for chronic MSSA osteo) >> 08/14/14 (planned) Vanc 12/24 >> 01/05 Cipro 12/21 >> 12/26, 01/03 >> 01/07 Enteral vanc 12/28 >> 1/11 Ceftaz 01/04 >> 01/07 Ceftaz-avibactam (AVYCAZ) 01/07 >>   SUBJECTIVE: No acute events overnight.   VITAL SIGNS: Temp:  [98.3 F (36.8 C)-98.9 F (37.2 C)] 98.4 F (36.9 C) (01/20 0900) Pulse Rate:  [73-84] 73 (01/20 1125) Resp:  [14-28] 28 (01/20 1125) BP:  (100-139)/(60-79) 103/66 mmHg (01/20 1125) SpO2:  [100 %] 100 % (01/20 1125) FiO2 (%):  [30 %] 30 % (01/20 1125) Weight:  [64.638 kg (142 lb 8 oz)] 64.638 kg (142 lb 8 oz) (01/20 0430)   VENTILATOR SETTINGS: Vent Mode:  [-] PSV;CPAP FiO2 (%):  [30 %] 30 % Set Rate:  [15 bmp] 15 bmp Vt Set:  [450 mL] 450 mL PEEP:  [5 cmH20] 5 cmH20 Pressure Support:  [10 cmH20-15 cmH20] 15 cmH20 Plateau Pressure:  [21 cmH20-22 cmH20] 22 cmH20   INTAKE / OUTPUT: Intake/Output      01/19 0701 - 01/20 0700 01/20 0701 - 01/21 0700   I.V. (mL/kg) 1080 (16.7)    NG/GT 2695    IV Piggyback 200    Total Intake(mL/kg) 3975 (61.5)    Urine (mL/kg/hr) 1000 (0.6) 180 (0.5)   Stool     Total Output 1000 180   Net +2975 -180          PHYSICAL EXAMINATION: General: NAD Neuro: RASS 0. , blinks on command but otherwise non-communicative. HEENT: WNL, trach in place Cardiovascular: RRR, reg, 3/6 systolic M Lungs: clear anteriorly, unlabored on vent looks comfortable on PSV Abdomen: soft, non tender, PEG site clean, normoactive BS Ext: no edema, no cyanosis   Recent Labs Lab 07/14/14 0500 07/15/14 0500 07/19/14 0432  NA 142 140 142  K 3.0* 3.9 3.3*  CL 102 103 102  CO2 30 33* 29  BUN CREATININE <0.30* <0.30* <0.30*  GLUCOSE 166* 124* 130*    Recent Labs Lab 07/14/14 0500 07/19/14 0432  HGB 10.0*  10.0*  HCT 32.2* 31.9*  WBC 9.0 9.9  PLT 268 237   CXR No results found.   CBG (last 3)   Recent Labs  07/18/14 1613 07/19/14 0054 07/19/14 0850  GLUCAP 119* 98 137*     ASSESSMENT / PLAN:  Chronic VDRF; Chronic tracheostomy status. Resolving PNA/ HCAP (serratia).  Plan:   Continue full vent support, cycling on PSV but will not wean her at home  Continue vent bundle SLP will continue to work w/ augmentative communication  Klebsiella bacteremia - highly resistant (ESBL and carbapenemase producing). Plan:  Avycaz (ceftaz-avibactam) 2.5g IV q8h; per ID >> planned d/c date  07/25/14 Follow WBC intermittently and fever curve  C diff Colitis.  Plan: Off therapy Insure H2 blocker (Pepcid) and avoid PPI   Thrush noted 1/17 pm Plan:  Not going to tolerate Nystatin PO Will change to diflucan x 3 days, first day 1/18  Sepsis Sinus tachycardia, controlled. Chronic hypotension, controlled on midodrine. Plan:  Continue lopressor and midodrine   Chronic cervical osteomyelitis/diskitis. Plan: On dicloxacillin through 08/14/14  Hypokalemia. Plan F/u and replace as needed  Protein calorie malnutrition.  Plan Continue tubefeeds   Quadriplegic/ physical deconditioning. Hx of anxiety/ depression Plan Continue supportive care PT and speech following Fentanyl patch >> changed to 50 mcg on 1/15  Anemia of chronic disease. Plan: F/u CBC intermittently lovenox for DVT prophylaxis  Summary: Poss home d/c after ABX IV completed 07/25/14. Absolutely no change as of 1/20  Simonne MartinetPeter E Babcock, NP [phone]   Attending Note:  I have examined patient, reviewed labs, studies and notes. I have discussed the case with Kreg ShropshireP Babcock, and I agree with the data and plans as amended above.   Levy Pupaobert Laelynn Blizzard, MD, PhD 07/19/2014, 1:30 PM [facility] [phone] or if no answer [phone]

## 2014-07-19 NOTE — Progress Notes (Signed)
Tolerating weaning all day on PS of 15 and 5 of PEEP 02 sats good suction PRN and q 2 to 3 hrs. Had a few short episodes of bigeminy self resolved.

## 2014-07-19 NOTE — Progress Notes (Signed)
PT Cancellation Note  Patient Details Name: Kathryn Booth MRN: [id number] DOB: 12-03-1970   Cancelled Treatment:    Reason Eval/Treat Not Completed: Other (comment) (family not here to perform education)   Fabio AsaWerner, Waleed Dettman J 07/19/2014, 4:20 PM [place] Crumbevon Purvi Ruehl, PT DPT  [phone]

## 2014-07-20 LAB — GLUCOSE, CAPILLARY
GLUCOSE-CAPILLARY: 127 mg/dL — AB (ref 70–99)
GLUCOSE-CAPILLARY: 130 mg/dL — AB (ref 70–99)
GLUCOSE-CAPILLARY: 154 mg/dL — AB (ref 70–99)

## 2014-07-20 NOTE — Progress Notes (Signed)
PULMONARY / CRITICAL CARE MEDICINE   Name: Kathryn Booth MRN: [id number] DOB: Jan 02, 1971    ADMISSION DATE:  06/19/2014 CONSULTATION DATE:  07/20/2014  REFERRING MD :  [facility]  CHIEF COMPLAINT:  Tachycardia  INITIAL PRESENTATION:   44 y.o. 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  Recently admitted to [facility] (06/07/14).  Has had persistent tachycardia while at [facility].  Brought to [facility] ED 12/21 after husbands request for further evaluation of tachycardia.  In ED, UA suggestive of UTI.   STUDIES/SIGNIFICANT EVENTS: 10/12  Debridement of cervical abscess [facility]) 12/09  Admitted to [facility] 12/21  Admitted to [facility] with hx as above 12/28 Husband definitely wants to take patient home. Care manager says things are being set up. Temp now 102.22F -- ongoing fevers 101-103F. Controlled wit tylenol.  01/06 Labile fevers 01/07 Blood culture from 01/03 positive for ESBL and carbapenem resistant Klebsiella. ID consult. AVYCAZ initiated (ceftaz-avibactam)  STUDIES: 12/28 CT chest: Dense left lower lobe collapse/ consolidation, suggestive of pneumonia. There is patchy less confluent airspace disease in the left upper lobe.  INDWELLING DEVICES:: Chronic trach  RUE PICC (from [facility])  >> 1/03 LUE PICC 1/03 >> 1/12 Mid line PICC 1/12 >>  MICRO DATA: Resp 12/22 >> Serratia (pansens) Blood 12/22 >> 1/2 enterococcus C diff 12/29 >> POSITIVE Blood 01/03 >> 1/2 ESBL, carbapenem-resistent Klebsiella Blood 1/08 >>  PICC Cath tip 1/12 >>   CURRENT ANTIMICROBIALS:  Dicloxacillin (for chronic MSSA osteo) >> 08/14/14 (planned) Vanc 12/24 >> 01/05 Cipro 12/21 >> 12/26, 01/03 >> 01/07 Enteral vanc 12/28 >> 1/11 Ceftaz 01/04 >> 01/07 Ceftaz-avibactam (AVYCAZ) 01/07 >>   SUBJECTIVE: No acute events overnight.   VITAL SIGNS: Temp:  [97.4 F (36.3 C)-98.6 F (37 C)] 98.1 F (36.7 C) (01/21 0758) Pulse Rate:  [69-96] 80 (01/21 0758) Resp:  [19-29] 21 (01/21 0758) BP:  (93-139)/(60-79) 105/63 mmHg (01/21 0758) SpO2:  [99 %-100 %] 100 % (01/21 0758) FiO2 (%):  [30 %] 30 % (01/21 0758) Weight:  [64.728 kg (142 lb 11.2 oz)] 64.728 kg (142 lb 11.2 oz) (01/21 0500)   VENTILATOR SETTINGS: Vent Mode:  [-] PCV;CPAP FiO2 (%):  [30 %] 30 % Set Rate:  [15 bmp] 15 bmp Vt Set:  [450 mL] 450 mL PEEP:  [5 cmH20] 5 cmH20 Pressure Support:  [10 cmH20-15 cmH20] 10 cmH20   INTAKE / OUTPUT: Intake/Output      01/20 0701 - 01/21 0700 01/21 0701 - 01/22 0700   I.V. (mL/kg) 130 (2) 10 (0.2)   NG/GT 1245 55   IV Piggyback 200    Total Intake(mL/kg) 1575 (24.3) 65 (1)   Urine (mL/kg/hr) 1140 (0.7) 150 (0.9)   Stool 0 (0)    Total Output 1140 150   Net +435 -85        Stool Occurrence 4 x      PHYSICAL EXAMINATION: General: NAD Neuro: RASS 0. , blinks on command but otherwise non-communicative. HEENT: WNL, trach in place Cardiovascular: RRR, reg, 3/6 systolic M Lungs: clear anteriorly, unlabored on vent looks comfortable on PSV still  Abdomen: soft, non tender, PEG site clean, normoactive BS Ext: no edema, no cyanosis   Recent Labs Lab 07/14/14 0500 07/15/14 0500 07/19/14 0432  NA 142 140 142  K 3.0* 3.9 3.3*  CL 102 103 102  CO2 30 33* 29  BUN CREATININE <0.30* <0.30* <0.30*  GLUCOSE 166* 124* 130*    Recent Labs Lab 07/14/14 0500  07/19/14 0432  HGB 10.0* 10.0*  HCT 32.2* 31.9*  WBC 9.0 9.9  PLT 268 237   CXR No results found.   CBG (last 3)   Recent Labs  07/19/14 1533 07/19/14 2338 07/20/14 0812  GLUCAP 148* 130* 127*     ASSESSMENT / PLAN:  Chronic VDRF; Chronic tracheostomy status. Resolving PNA/ HCAP (serratia).  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 Plan:  Avycaz (ceftaz-avibactam) 2.5g IV q8h; per ID >> planned d/c date  07/25/14 Follow WBC intermittently and fever curve  C diff Colitis.  Plan: Off therapy Insure H2 blocker (Pepcid) and avoid PPI   Thrush noted 1/17 pm Plan:  Not going to tolerate Nystatin PO Will change to diflucan x 3 days, first day 1/18  Sepsis Sinus tachycardia, controlled. Chronic hypotension, controlled on midodrine. Plan:  Continue lopressor and midodrine   Chronic cervical osteomyelitis/diskitis. Plan: On dicloxacillin through 08/14/14  Hypokalemia. Plan F/u and replace as needed  Protein calorie malnutrition.  Plan Continue tubefeeds   Quadriplegic/ physical deconditioning. Hx of anxiety/ depression Plan Continue supportive care PT and speech following Fentanyl patch >> changed to 50 mcg on 1/15  Anemia of chronic disease. Plan: F/u CBC intermittently lovenox for DVT prophylaxis  Summary: Poss home d/c after ABX IV completed 07/25/14. Absolutely no change as of 1/21  Simonne MartinetPeter E Babcock, NP [phone]  Attending Note:  I have examined patient, reviewed labs, studies and notes. I have discussed the case with P babcock, and I agree with the data and plans as amended above.   Levy Pupaobert Mourad Cwikla, MD, PhD 07/20/2014, 1:19 PM [place] [facility] [facility] [phone] or if no answer [phone]

## 2014-07-20 NOTE — Progress Notes (Signed)
PT Cancellation Note  Patient Details Name: Kathryn Booth MRN: [id number] DOB: 10-Aug-1970   Cancelled Treatment:     Attempted to return to provide hand outs and review HEP and positioning with patients husband and patient. Husband not available at this time. Will attempt next day.   Fabio AsaWerner, Khrystian Schauf J 07/20/2014, 3:13 PM [place] Crumbevon Bryann Mcnealy, PT DPT  [phone]

## 2014-07-20 NOTE — Trach Care Team (Signed)
Trach Care Progression Note   Patient Details Name: Kathryn Booth MRN: [id number] DOB: 1971/04/02 Today's Date: 07/20/2014   Tracheostomy Assessment    Tracheostomy Shiley 6 mm Cuffed (Active)  Status Secured 07/20/2014 12:22 PM  Site Assessment Clean;Dry 07/20/2014 12:22 PM  Site Care Protective barrier to skin 07/20/2014 12:22 PM  Inner Cannula Care Changed/new 07/20/2014  4:00 AM  Ties Assessment Clean;Dry;Secure 07/20/2014 12:22 PM  Cuff pressure (cm) 25 cm 07/20/2014 12:22 PM  Trach Changed Yes 07/12/2014  1:00 PM  Emergency Equipment at bedside Yes 07/20/2014 12:22 PM     Care Needs     Respiratory Therapy O2 Device: Ventilator FiO2 (%): 30 % SpO2: 100 % Who was educated?:  (none needed) Follow up recommendations: ? needed education when pt discharges home Respiratory barriers to progression:  (plan is for pt to go home with husband )    Speech Language Pathology  SLP chart review complete: Patient currently receiving at SLP services (for cognition, alternative communication, eye scanning) Follow up Recommendations:  (tba) SLP barriers to progression:  (lethargy)   Physical Therapy PT Recommendation/Assessment: Patient needs continued PT services Follow Up Recommendations: Home health PT, Supervision/Assistance - 24 hour (plan is for home dc per husband) PT equipment: Hospital bed, Other (comment) (air mattress, hoyer lift, reclining w/c)    Occupational Therapy      Nutritional Patient's Current Diet: Tube feeding Tube Feeding: Jevity 1.2 Cal Tube Feeding Frequency: Continuous Tube Feeding Strength: Full strength    Case Management/Social Work Level of patient care prior to hospitalization: LTACH Living status: Spouse Insurance payer: Medicaid Barriers to progression: expensive antibiotic- Avycaz Plan to address barriers: complete abx in-house Anticipated discharge disposition: Home-Home Health    Provider Trach Care Team/Provider                             Recommendations Trach Care Team Members Present-  Cherylin MylarLauren Doyle, RT, Shon BatonJenna Holloman, SW, Joaquin CourtsKimberly Harris, RD, Harlon DittyBonnie DeBlois, SLP,      None. Discharge plan is home with 24 hr care initially by home health.           Dezmond Downie, Silva BandyDebra Anita (scribe for team) 07/20/2014, 1:39 PM

## 2014-07-20 NOTE — Progress Notes (Signed)
Physical Therapy Treatment Patient Details Name: Kathryn Booth MRN: [id number] DOB: 01/18/1971 Today's Date: 07/20/2014    History of Present Illness 44 y.o. 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  Recently admitted to [facility] (06/07/14).  Has had persistent tachycardia while at [facility].  Brought to [facility] ED 12/21 after husbands request for further evaluation of tachycardia.  In ED, UA suggestive of UTI.    PT Comments    Met with patient and spouse re: home education for patient positioning, ROM and activity tolerance. Educated patient on how to don/doff Prevalon boots with heel positioned in free space.  Educated patient spouse regarding positioning for protection of skin over bony prominences. Discussed positional changes and frequency at length including ROM regiment with all positional changes.  Educated patient on techniques for PROM working from head to toe joint by joint one left side then mirroring technique on the right side. Spoke with husband in depth about attention to skin integrity and edema control.  Plan to return this afternoon for additional education session with husband and provide written handouts for husband and caregivers to review.   Follow Up Recommendations  Home health PT;Supervision/Assistance - 24 hour (plan is for home dc per husband)     [place] [facility] bed;Other (comment) (air mattress, hoyer lift, reclining w/c)    Recommendations for Other Services       Precautions / Restrictions Restrictions Weight Bearing Restrictions: No RUE Weight Bearing: Non weight bearing LUE Weight Bearing: Non weight bearing RLE Weight Bearing: Non weight bearing LLE Weight Bearing: Non weight bearing Other Position/Activity Restrictions: maintain BP status (autonomic dysreflexia risk)    Mobility  Bed Mobility                  Transfers                    Ambulation/Gait                  Stairs            Wheelchair Mobility    Modified Rankin (Stroke Patients Only)       Balance                                    Cognition Arousal/Alertness: Awake/alert   Overall Cognitive Status: Impaired/Different from baseline                      Exercises      General Comments        Pertinent Vitals/Pain Pain Assessment: Faces Faces Pain Scale: No hurt    Home Living                      Prior Function            PT Goals (current goals can now be found in the care plan section) Acute Rehab PT Goals PT Goal Formulation: With family Time For Goal Achievement: 07/26/14 Potential to Achieve Goals: Fair Progress towards PT goals: Progressing toward goals    Frequency  Min 1X/week    PT Plan Current plan remains appropriate    Co-evaluation             End of Session Equipment Utilized During Treatment:  (ventilator) Activity Tolerance: Patient limited by fatigue Patient left: in bed;with call bell/phone within reach;with  family/visitor present     Time: 1208-1226 PT Time Calculation (min) (ACUTE ONLY): 18 min  Charges:  $Self Care/Home Management: 8-22                    G CodesDuncan Dull 07/26/14, 1:13 PM Alben Deeds, Leslie DPT  [phone]

## 2014-07-21 DIAGNOSIS — J189 Pneumonia, unspecified organism: Secondary | ICD-10-CM | POA: Insufficient documentation

## 2014-07-21 LAB — GLUCOSE, CAPILLARY
GLUCOSE-CAPILLARY: 113 mg/dL — AB (ref 70–99)
Glucose-Capillary: 126 mg/dL — ABNORMAL HIGH (ref 70–99)
Glucose-Capillary: 105 mg/dL — ABNORMAL HIGH (ref 70–99)

## 2014-07-21 NOTE — Progress Notes (Signed)
PULMONARY / CRITICAL CARE MEDICINE   Name: Kathryn Booth MRN: [id number] DOB: 1970-07-09    ADMISSION DATE:  06/19/2014 CONSULTATION DATE:  07/21/2014  [facility]ING MD :  [facility]  CHIEF COMPLAINT:  Tachycardia  INITIAL PRESENTATION:   44 y.o. 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  Recently admitted to [facility] (06/07/14).  Has had persistent tachycardia while at [facility].  [facility] after husbands request for further evaluation of tachycardia.  In ED, UA suggestive of UTI.   STUDIES/SIGNIFICANT EVENTS: 10/12  Debridement of cervical abscess [facility] 12/09  Admitted to [facility] 12/21  [facility] 12/28 Husband definitely wants to take patient home. Care manager says things are being set up. Temp now 102.65F -- ongoing fevers 101-103F. Controlled wit tylenol.  01/06 Labile fevers 01/07 Blood culture from 01/03 positive for ESBL and carbapenem resistant Klebsiella. ID consult. AVYCAZ initiated (ceftaz-avibactam)  STUDIES: 12/28 CT chest: Dense left lower lobe collapse/ consolidation, suggestive of pneumonia. There is patchy less confluent airspace disease in the left upper lobe.  INDWELLING DEVICES:: Chronic trach  RUE PICC (from [facility])  >> 1/03 LUE PICC 1/03 >> 1/12 Mid line PICC 1/12 >>  MICRO DATA: Resp 12/22 >> Serratia (pansens) Blood 12/22 >> 1/2 enterococcus C diff 12/29 >> POSITIVE Blood 01/03 >> 1/2 ESBL, carbapenem-resistent Klebsiella Blood 1/08 >>  PICC Cath tip 1/12 >>   CURRENT ANTIMICROBIALS:  Dicloxacillin (for chronic MSSA osteo) >> 08/14/14 (planned) Vanc 12/24 >> 01/05 Cipro 12/21 >> 12/26, 01/03 >> 01/07 Enteral vanc 12/28 >> 1/11 Ceftaz 01/04 >> 01/07 Ceftaz-avibactam (AVYCAZ) 01/07 >>   SUBJECTIVE: No acute events overnight.   VITAL SIGNS: Temp:  [97.4 F (36.3 C)-99.4 F (37.4 C)] 99.4 F (37.4 C) (01/22 1157) Pulse Rate:  [68-94] 76 (01/22 1157) Resp:  [15-28] 16 (01/22 1157) BP:  (91-142)/(55-79) 108/65 mmHg (01/22 1157) SpO2:  [100 %] 100 % (01/22 1157) FiO2 (%):  [30 %] 30 % (01/22 1123) Weight:  [64.2 kg (141 lb 8.6 oz)] 64.2 kg (141 lb 8.6 oz) (01/22 0458)   VENTILATOR SETTINGS: Vent Mode:  [-] SIMV/PC/PS FiO2 (%):  [30 %] 30 % Set Rate:  [15 bmp] 15 bmp Vt Set:  [450 mL] 450 mL PEEP:  [5 cmH20] 5 cmH20 Pressure Support:  [10 cmH20-15 cmH20] 10 cmH20 Plateau Pressure:  [21 cmH20-23 cmH20] 21 cmH20   INTAKE / OUTPUT: Intake/Output      01/21 0701 - 01/22 0700 01/22 0701 - 01/23 0700   I.V. (mL/kg) 240 (3.7)    NG/GT 1320    IV Piggyback 150    Total Intake(mL/kg) 1710 (26.6)    Urine (mL/kg/hr) 1050 (0.7) 500 (1.4)   Stool 0 (0)    Total Output 1050 500   Net +660 -500        Stool Occurrence 2 x      PHYSICAL EXAMINATION: General: NAD; no change  Neuro: RASS 0. , blinks on command but otherwise non-communicative. HEENT: WNL, trach in place Cardiovascular: RRR, reg, 3/6 systolic M Lungs: clear anteriorly, unlabored on vent looks comfortable on PSV still  Abdomen: soft, non tender, PEG site clean, normoactive BS Ext: no edema, no cyanosis   Recent Labs Lab 07/15/14 0500 07/19/14 0432  NA 140 142  K 3.9 3.3*  CL 103 102  CO2 33* 29  BUN 7 9  CREATININE <0.30* <0.30*  GLUCOSE 124* 130*    Recent Labs Lab 07/19/14 0432  HGB 10.0*  HCT 31.9*  WBC 9.9  PLT 237   CXR No results found.   CBG (last 3)   Recent Labs  07/20/14 1641 07/21/14 0036 07/21/14 0818  GLUCAP 154* 113* 126*     ASSESSMENT / PLAN:  Chronic VDRF; Chronic tracheostomy status. Resolving PNA/ HCAP (serratia).  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 Plan:  Avycaz (ceftaz-avibactam) 2.5g IV q8h; per ID >> planned d/c date 07/25/14 Follow WBC intermittently and  fever curve  C diff Colitis.  Plan: Off therapy Insure H2 blocker (Pepcid) and avoid PPI   Sepsis Sinus tachycardia, controlled. Chronic hypotension, controlled on midodrine. Plan:  Continue lopressor and midodrine   Chronic cervical osteomyelitis/diskitis. Plan: On dicloxacillin through 08/14/14  Hypokalemia. Plan F/u and replace as needed  Protein calorie malnutrition.  Plan Continue tubefeeds   Quadriplegic/ physical deconditioning. Hx of anxiety/ depression Plan Continue supportive care PT and speech following Fentanyl patch >> changed to 50 mcg on 1/15  Anemia of chronic disease. Plan: F/u CBC intermittently lovenox for DVT prophylaxis  Summary: Poss home d/c after ABX IV completed 07/25/14. Absolutely no change as of 1/22  Simonne Martinet, NP [phone]  Levy Pupa, MD, PhD 07/21/2014, 1:18 PM [place] [facility] [phone] or if no answer [phone]

## 2014-07-22 LAB — GLUCOSE, CAPILLARY
GLUCOSE-CAPILLARY: 105 mg/dL — AB (ref 70–99)
GLUCOSE-CAPILLARY: 146 mg/dL — AB (ref 70–99)
Glucose-Capillary: 112 mg/dL — ABNORMAL HIGH (ref 70–99)
Glucose-Capillary: 138 mg/dL — ABNORMAL HIGH (ref 70–99)

## 2014-07-22 NOTE — Progress Notes (Signed)
PULMONARY / CRITICAL CARE MEDICINE   Name: Kathryn Booth MRN: [id number] DOB: 08-26-1970    ADMISSION DATE:  06/19/2014 CONSULTATION DATE:  07/22/2014  [facility]ING MD :  [facility]  CHIEF COMPLAINT:  Tachycardia  INITIAL PRESENTATION:   44 y.o. 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   admitted to [facility] 06/07/14.  Has had persistent tachycardia while at [facility].  Brought to [facility] ED 12/21 after husbands request for further evaluation of tachycardia.  In ED, UA suggestive of UTI.   STUDIES/SIGNIFICANT EVENTS: 10/12  Debridement of cervical abscess [facility]) 12/09  Admitted to [facility] 12/21  Admitted to [facility] with hx as above 12/28 Husband definitely wants to take patient home. Care manager says things are being set up. Temp  102.56F -- ongoing fevers 101-103F. Controlled wit tylenol.  01/06 Labile fevers 01/07 Blood culture from 01/03 positive for ESBL and carbapenem resistant Klebsiella. ID consult. AVYCAZ initiated (ceftaz-avibactam)  STUDIES: 12/28 CT chest: Dense left lower lobe collapse/ consolidation, suggestive of pneumonia. There is patchy less confluent airspace disease in the left upper lobe.  INDWELLING DEVICES:: Chronic trach  RUE PICC (from [facility])  >> 1/03 LUE PICC 1/03 >> 1/12 Mid line PICC 1/12 >>  MICRO DATA: Resp 12/22 >> Serratia (pansens) Blood 12/22 >> 1/2 enterococcus C diff 12/29 >> POSITIVE Blood 01/03 >> 1/2 ESBL, carbapenem-resistent Klebsiella Blood 1/08 >  Neg x 2 PICC Cath tip 1/12 >  NEG    CURRENT ANTIMICROBIALS:  Dicloxacillin (for chronic MSSA osteo) >> 08/14/14 (planned) Vanc 12/24 >> 01/05 Cipro 12/21 >> 12/26, 01/03 >> 01/07 Enteral vanc 12/28 >> 1/11 Ceftaz 01/04 >> 01/07 Ceftaz-avibactam (AVYCAZ) 01/07 >>   SUBJECTIVE: No acute events overnight/ comfortable on vent   VITAL SIGNS: Temp:  [98 F (36.7 C)-99.4 F (37.4 C)] 98.3 F (36.8 C) (01/23 0758) Pulse Rate:  [67-80] 75 (01/23 0758) Resp:  [15-21] 20  (01/23 0758) BP: (96-123)/(43-65) 96/43 mmHg (01/23 0758) SpO2:  [100 %] 100 % (01/23 0700) FiO2 (%):  [30 %] 30 % (01/23 0801)   VENTILATOR SETTINGS: Vent Mode:  [-] SIMV;PSV FiO2 (%):  [30 %] 30 % Set Rate:  [15 bmp] 15 bmp Vt Set:  [450 mL] 450 mL PEEP:  [5 cmH20] 5 cmH20 Pressure Support:  [10 cmH20] 10 cmH20 Plateau Pressure:  [21 cmH20] 21 cmH20   INTAKE / OUTPUT: Intake/Output      01/22 0701 - 01/23 0700 01/23 0701 - 01/24 0700   I.V. (mL/kg) 230 (3.6) 10 (0.2)   Other 220    NG/GT 1390    IV Piggyback 100    Total Intake(mL/kg) 1940 (30.2) 10 (0.2)   Urine (mL/kg/hr) 1575 (1) 300 (1.6)   Stool     Total Output 1575 300   Net +365 -290          PHYSICAL EXAMINATION: General: NAD; no change  Neuro: RASS 0. , blinks on command but otherwise non-communicative. HEENT: WNL, trach in place Cardiovascular: RRR, reg, 3/6 systolic M Lungs: clear anteriorly, unlabored on vent looks comfortable on PSV still  Abdomen: soft, non tender, PEG site clean, normoactive BS Ext: no edema, no cyanosis   Recent Labs Lab 07/19/14 0432  NA 142  K 3.3*  CL 102  CO2 29  BUN 9  CREATININE <0.30*  GLUCOSE 130*    Recent Labs Lab 07/19/14 0432  HGB 10.0*  HCT 31.9*  WBC 9.9  PLT 237   CXR No results found.  CBG (last 3)   Recent Labs  07/21/14 1736 07/22/14 0006 07/22/14 0844  GLUCAP 105* 138* 146*     ASSESSMENT / PLAN:  Chronic VDRF; 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  Plan:   Continue full vent support, cycling on PSV   Continue vent bundle Has not been able to participate in SLP work   Klebsiella bacteremia - highly resistant (ESBL and carbapenemase producing). Plan:  Avycaz (ceftaz-avibactam) 2.5g IV q8h; per ID >> planned d/c date 07/25/14 Follow WBC intermittently and fever curve  C diff Colitis.  Plan: Off therapy   H2 blocker (Pepcid) and avoid PPI   Sepsis Sinus tachycardia,  controlled. Chronic hypotension, controlled on midodrine. Plan:  Continue lopressor and midodrine   Chronic cervical osteomyelitis/diskitis. Plan: On dicloxacillin through 08/14/14  Hypokalemia. Plan F/u and replace as needed  Protein calorie malnutrition.  Plan Continue tubefeeds   Quadriplegic/ physical deconditioning. Hx of anxiety/ depression Plan Continue supportive care PT and speech following Fentanyl patch >> changed to 50 mcg on 1/15  Anemia of chronic disease. Plan: F/u CBC intermittently lovenox for DVT prophylaxis  Summary: Poss home d/c after ABX IV completed 07/25/14.       Discussed with husband at bedside    Sandrea HughsMichael Nakisha Chai, MD Pulmonary and Critical Care Medicine [facility] Cell [phone] After 5:30 PM or weekends, call [phone]

## 2014-07-23 LAB — GLUCOSE, CAPILLARY
GLUCOSE-CAPILLARY: 140 mg/dL — AB (ref 70–99)
Glucose-Capillary: 109 mg/dL — ABNORMAL HIGH (ref 70–99)

## 2014-07-23 NOTE — Progress Notes (Signed)
PULMONARY / CRITICAL CARE MEDICINE   Name: Kathryn Booth MRN: [id number] DOB: 1971-06-29    ADMISSION DATE:  06/19/2014 CONSULTATION DATE:  07/23/2014  REFERRING MD :  [facility]  CHIEF COMPLAINT:  Tachycardia  INITIAL PRESENTATION:   24 yobf  who is quadriplegic due to cervical abscess s/p debridement 04/10/14.   admitted to [facility] 06/07/14.  Has had persistent tachycardia while at [facility].  Brought to [facility] ED 12/21 after husbands request for further evaluation of tachycardia.  In ED, UA suggestive of UTI.   STUDIES/SIGNIFICANT EVENTS: 10/12  Debridement of cervical abscess [facility]) 12/09  Admitted to [facility] 12/21  Admitted to [facility] with hx as above 12/28 Husband definitely wants to take patient home. Care manager says things are being set up. Temp  102.33F -- ongoing fevers 101-103F. Controlled wit tylenol.  01/06 Labile fevers 01/07 Blood culture from 01/03 positive for ESBL and carbapenem resistant Klebsiella. ID consult. AVYCAZ initiated (ceftaz-avibactam)  STUDIES: 12/28 CT chest: Dense left lower lobe collapse/ consolidation, suggestive of pneumonia. There is patchy less confluent airspace disease in the left upper lobe.  INDWELLING DEVICES:: Chronic trach  RUE PICC (from [facility])  >> 1/03 LUE PICC 1/03 >> 1/12 Mid line PICC 1/12 >>  MICRO DATA: Resp 12/22 >> Serratia (pansens) Blood 12/22 >> 1/2 enterococcus C diff 12/29 >> POSITIVE Blood 01/03 >> 1/2 ESBL, carbapenem-resistent Klebsiella Blood 1/08 >  Neg x 2 PICC Cath tip 1/12 >  NEG    CURRENT ANTIMICROBIALS:  Dicloxacillin (for chronic MSSA osteo) >> 08/14/14 (planned) Vanc 12/24 >> 01/05 Cipro 12/21 >> 12/26, 01/03 >> 01/07 Enteral vanc 12/28 >> 1/11 Ceftaz 01/04 >> 01/07 Ceftaz-avibactam (AVYCAZ) 01/07 >>   SUBJECTIVE: No acute events overnight/ comfortable on vent/no weaning    VITAL SIGNS: Temp:  [97.8 F (36.6 C)-99.2 F (37.3 C)] 98.3 F (36.8 C) (01/24 0824) Pulse Rate:  [58-82] 67 (01/24  0912) Resp:  [15-30] 18 (01/24 0824) BP: (90-117)/(47-70) 106/70 mmHg (01/24 0912) SpO2:  [100 %] 100 % (01/24 0824) FiO2 (%):  [30 %] 30 % (01/24 0824)   VENTILATOR SETTINGS: Vent Mode:  [-] SIMV;PSV FiO2 (%):  [30 %] 30 % Set Rate:  [15 bmp] 15 bmp Vt Set:  [450 mL] 450 mL PEEP:  [5 cmH20] 5 cmH20 Pressure Support:  [10 cmH20] 10 cmH20   INTAKE / OUTPUT: Intake/Output      01/23 0701 - 01/24 0700 01/24 0701 - 01/25 0700   I.V. (mL/kg) 120 (1.9)    Other 350    NG/GT 1485    IV Piggyback     Total Intake(mL/kg) 1955 (30.5)    Urine (mL/kg/hr) 1700 (1.1)    Stool 1 (0)    Total Output 1701     Net +254          Stool Occurrence 1 x      PHYSICAL EXAMINATION: General: NAD; no change  Neuro:     non-communicative to verbal  HEENT: WNL, trach in place Cardiovascular: RRR, reg, 3/6 systolic M Lungs: clear anteriorly, unlabored on vent looks comfortable on PSV still  Abdomen: soft, non tender, PEG site clean, normoactive BS Ext: no edema, no cyanosis   Recent Labs Lab 07/19/14 0432  NA 142  K 3.3*  CL 102  CO2 29  BUN 9  CREATININE <0.30*  GLUCOSE 130*    Recent Labs Lab 07/19/14 0432  HGB 10.0*  HCT 31.9*  WBC 9.9  PLT 237   CXR No results found.  CBG (last 3)   Recent Labs  07/22/14 0844 07/22/14 1758 07/22/14 2356  GLUCAP 146* 112* 105*     ASSESSMENT / PLAN:  Chronic VDRF; 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  Plan:   Continue full vent support, cycling on PSV  If tolerates Continue vent bundle Has not been able to participate in SLP work   Klebsiella bacteremia - highly resistant (ESBL and carbapenemase producing). Plan:  Avycaz (ceftaz-avibactam) 2.5g IV q8h; per ID >> planned d/c date 07/25/14 Follow WBC intermittently and fever curve  C diff Colitis.  Plan: Off therapy   H2 blocker (Pepcid) and avoid PPI   Sepsis Sinus tachycardia, controlled. Chronic hypotension,  controlled on midodrine. Plan:  Continue lopressor and midodrine   Chronic cervical osteomyelitis/diskitis. Plan: On dicloxacillin through 08/14/14  Hypokalemia. Plan F/u and replace as needed  Protein calorie malnutrition.  Plan Continue tubefeeds   Quadriplegic/ physical deconditioning. Hx of anxiety/ depression Plan Continue supportive care PT and speech following Fentanyl patch >> changed to 50 mcg on 1/15  Anemia of chronic disease. Plan: F/u CBC intermittently lovenox for DVT prophylaxis  Summary: Poss home d/c after ABX IV completed 07/25/14  Prognosis appears extremely poor for any kind of quality of life outcome here         Sandrea HughsMichael Sherrey North, MD Pulmonary and Critical Care Medicine  Healthcare Cell [phone] After 5:30 PM or weekends, call [phone]

## 2014-07-24 LAB — GLUCOSE, CAPILLARY
GLUCOSE-CAPILLARY: 144 mg/dL — AB (ref 70–99)
Glucose-Capillary: 121 mg/dL — ABNORMAL HIGH (ref 70–99)
Glucose-Capillary: 133 mg/dL — ABNORMAL HIGH (ref 70–99)

## 2014-07-24 NOTE — Progress Notes (Signed)
Speech Language Pathology Treatment: Cognitive-Linquistic  Patient Details Name: Kathryn Booth MRN: [id number] DOB: [birth year] Today's Date: 07/24/2014 Time: 1100-1120 SLP Time Calculation (min) (ACUTE ONLY): 20 min  Assessment / Plan / Recommendation Clinical Impression  Pt able to correctly respond to factual Y/N question with eye gaze with 10/10 accuracy. With max verbal and visual cues pt was not able to successfully use spelling eye gaze board to spell words on command. Pt gaze repeatedly drifting father left than target. Pt may benefit from list of Y/N questions to better communicate basic wants and needs. Will also attempt a different format of eye gaze for wants/needs. F/u needed with RN and family to to determine if they are utilizing eye gaze methods for communication.    HPI HPI: 44 y.o. 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 Chronic trach and vent. Chronic Encephalopathy: etiology unclear-> hyperthermia vs hypoperfusion vs ongoing infection. Has had EEGs that are NOT c/w being locked in.  Recently admitted to [facility] (06/07/14). Has had persistent tachycardia while at [facility]. [facility] after husbands request for further evaluation of tachycardia. 01/07 Blood culture from 01/03 positive for ESBL and carbapenem resistant Klebsiella. ID consult. AVYCAZ initiated (ceftaz-avibactam).     Pertinent Vitals    SLP Plan  Continue with current plan of care    Recommendations                Plan: Continue with current plan of care    GO    [facility] Surgery CenterBonnie Shantae Vantol, MA CCC-SLP [phone]  Claudine MoutonDeBlois, Stephanos Fan Caroline 07/24/2014, 12:27 PM

## 2014-07-24 NOTE — Progress Notes (Signed)
PULMONARY / CRITICAL CARE MEDICINE   Name: Kathryn Booth MRN: [id number] DOB: 12-21-1970    ADMISSION DATE:  06/19/2014 CONSULTATION DATE:  07/24/2014  REFERRING MD :  [facility]  CHIEF COMPLAINT:  Tachycardia  INITIAL PRESENTATION:   19 yobf  who is quadriplegic due to cervical abscess s/p debridement 04/10/14.   admitted to [facility] 06/07/14.  Has had persistent tachycardia while at [facility].  Brought to [facility] ED 12/21 after husbands request for further evaluation of tachycardia.  In ED, UA suggestive of UTI.  STUDIES/SIGNIFICANT EVENTS: 10/12  Debridement of cervical abscess [facility]) 12/09  Admitted to [facility] 12/21  Admitted to [facility] with hx as above 12/28 Husband definitely wants to take patient home. Care manager says things are being set up. Temp  102.37F -- ongoing fevers 101-103F. Controlled wit tylenol.  01/06 Labile fevers 01/07 Blood culture from 01/03 positive for ESBL and carbapenem resistant Klebsiella. ID consult. AVYCAZ initiated (ceftaz-avibactam)  STUDIES: 12/28 CT chest: Dense left lower lobe collapse/ consolidation, suggestive of pneumonia. There is patchy less confluent airspace disease in the left upper lobe.  INDWELLING DEVICES:: Chronic trach  RUE PICC (from [facility])  >> 1/03 LUE PICC 1/03 >> 1/12 Mid line PICC 1/12 >>  MICRO DATA: Resp 12/22 >> Serratia (pansens) Blood 12/22 >> 1/2 enterococcus C diff 12/29 >> POSITIVE Blood 01/03 >> 1/2 ESBL, carbapenem-resistent Klebsiella Blood 1/08 >  Neg x 2 PICC Cath tip 1/12 >  NEG    CURRENT ANTIMICROBIALS:  Dicloxacillin (for chronic MSSA osteo) >> 08/14/14 (planned) Vanc 12/24 >> 01/05 Cipro 12/21 >> 12/26, 01/03 >> 01/07 Enteral vanc 12/28 >> 1/11 Ceftaz 01/04 >> 01/07 Ceftaz-avibactam (AVYCAZ) 01/07 >>   SUBJECTIVE: Communicating with speech well  VITAL SIGNS: Temp:  [97.6 F (36.4 C)-98 F (36.7 C)] 97.6 F (36.4 C) (01/25 0812) Pulse Rate:  [66-89] 79 (01/25 0954) Resp:  [15-21] 17 (01/25 0812) BP:  (96-122)/(60-67) 104/66 mmHg (01/25 0954) SpO2:  [100 %] 100 % (01/25 0812) FiO2 (%):  [30 %] 30 % (01/25 0812) Weight:  [81.647 kg (180 lb)] 81.647 kg (180 lb) (01/25 0413)   VENTILATOR SETTINGS: Vent Mode:  [-] SIMV/PC/PS FiO2 (%):  [30 %] 30 % Set Rate:  [15 bmp] 15 bmp Vt Set:  [450 mL] 450 mL PEEP:  [5 cmH20] 5 cmH20 Pressure Support:  [10 cmH20] 10 cmH20 Plateau Pressure:  [22 cmH20] 22 cmH20   INTAKE / OUTPUT: Intake/Output      01/24 0701 - 01/25 0700 01/25 0701 - 01/26 0700   I.V. (mL/kg)     Other     NG/GT 1355    IV Piggyback 150    Total Intake(mL/kg) 1505 (18.4)    Urine (mL/kg/hr) 1050 (0.5) 275 (0.8)   Stool     Total Output 1050 275   Net +455 -275          PHYSICAL EXAMINATION: General: eyes open, communicating well with eyes Neuro:     non-communicative to verbal  HEENT: WNL, trach in place Cardiovascular: RRR, reg, 3/6 systolic M no changes Lungs: clear anteriorly Abdomen: soft, non tender, PEG site clean, normoactive BS Ext: no edema, no cyanosis   Recent Labs Lab 07/19/14 0432  NA 142  K 3.3*  CL 102  CO2 29  BUN 9  CREATININE <0.30*  GLUCOSE 130*    Recent Labs Lab 07/19/14 0432  HGB 10.0*  HCT 31.9*  WBC 9.9  PLT 237   CXR No results found.   CBG (last  3)   Recent Labs  07/23/14 1147 07/24/14 0023 07/24/14 0829  GLUCAP 140* 121* 144*     ASSESSMENT / PLAN:  Chronic VDRF; 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  Plan:   Continue full vent support, cycling on PSV  As tolerates Will change to HOME VENT today in hopes for goal DC tomorrow afternoon abg in am on these settings planned to ensure adequate MV and tolerability  Klebsiella bacteremia - highly resistant (ESBL and carbapenemase producing). Plan:  Avycaz (ceftaz-avibactam) 2.5g IV q8h; per ID >> planned d/c date 07/25/14 Allow to dc 1/26 will need ID follow up  C diff Colitis.  Plan: H2 blocker (Pepcid) and  avoid PPI with cdiff risk  Sepsis Sinus tachycardia, controlled. Chronic hypotension, controlled on midodrine. Plan:  Continue lopressor and midodrine  Consider dc tele after home vent assessed  Chronic cervical osteomyelitis/diskitis. Plan: On dicloxacillin through 08/14/14  Hypokalemia. Plan F/u and replace as needed bmet in am needed  Protein calorie malnutrition.  Plan Continue tubefeeds   Quadriplegic/ physical deconditioning. Hx of anxiety/ depression Plan Continue supportive care PT and speech following- appreciated, communicates well with eyes Fentanyl patch 50 mcg on 1/15  Anemia of chronic disease. Plan: F/u CBC intermittently, am planned lovenox for DVT prophylaxis  Summary: Poss home d/c after ABX IV completed 07/25/14  \for home vent today, abg in am  Prognosis appears extremely poor for any kind of quality of life outcome here    Mcarthur Rossettianiel J. Tyson AliasFeinstein, MD, FACP Pgr: [phone] [place] [facility]

## 2014-07-24 NOTE — Progress Notes (Signed)
NUTRITION FOLLOW UP  Intervention:   Continue TF via PEG, Jevity 1.2 at 55 ml/h with Prostat 30 ml once daily, providing 1684 kcals, 88 gm protein, 1069 ml free water daily.   Nutrition Dx:   Inadequate oral intake related to inability to eat as evidenced by NPO status. Ongoing.  Goal:   Intake to meet >90% of estimated nutrition needs. Met.  Monitor:   TF tolerance/adequacy, weight trend, labs, vent status.  Assessment:   44 y.o. 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 Recently admitted to [place] (06/07/14). Has had persistent tachycardia while at [facility]. Brought to [facility] ED 12/21 after husbands request for further evaluation of tachycardia. In ED, UA suggestive of UTI.  Patient has PEG in place with Jevity 1.2 infusing @ 55 ml/hr. 30 ml Prostat via tube once daily. Tube feeding regimen currently providing 1684 kcal, 88 grams protein, and 1069 ml H2O.   Per MD note, current plans to change to hone vent today in hopes for discharge tomorrow.  Patient with chronic trach on ventilator support MV: 8.3, 8.7 L/min Temp (24hrs), Avg:97.6 F (36.4 C), Min:97.2 F (36.2 C), Max:98 F (36.7 C)  Propofol: none  Labs and medications reviewed.  Height: Ht Readings from Last 1 Encounters:  07/20/14 5' (1.524 m)    Weight Status:   Wt Readings from Last 1 Encounters:  07/24/14 180 lb (81.647 kg)  07/03/14 143 lb 06/26/14 136 lb 3.9 oz (61.8 kg)   06/20/14 138 lb 10.7 oz (62.9 kg)  Question accuracy of today's weight?  Re-estimated needs:  Kcal: 1419 Protein: 85-100 gm Fluid: 1.6-1.8 L  Skin: stage 2 pressure ulcer on coccyx, non-pitting UE edema  Diet Order:     Intake/Output Summary (Last 24 hours) at 07/24/14 1411 Last data filed at 07/24/14 1240  Gross per 24 hour  Intake   1060 ml  Output   1575 ml  Net   -515 ml    Last BM: 1/24  Labs:   Recent Labs Lab 07/19/14 0432  NA 142  K 3.3*  CL 102  CO2 29  BUN 9   CREATININE <0.30*  CALCIUM 9.9  GLUCOSE 130*    CBG (last 3)   Recent Labs  07/23/14 1147 07/24/14 0023 07/24/14 0829  GLUCAP 140* 121* 144*    Scheduled Meds: . antiseptic oral rinse  7 mL Mouth Rinse QID  . ceftazidime avibactam (AVYCAZ) IVPB  2.5 g Intravenous 3 times per day  . chlorhexidine  15 mL Mouth Rinse BID  . dicloxacillin  500 mg Per Tube 3 times per day  . enoxaparin (LOVENOX) injection  40 mg Subcutaneous Q24H  . famotidine  20 mg Per Tube Daily  . feeding supplement (PRO-STAT SUGAR FREE 64)  30 mL Per Tube Q1500  . fentaNYL  50 mcg Transdermal Q72H  . metoprolol tartrate  50 mg Per Tube BID  . midodrine  5 mg Per Tube TID WC  . PARoxetine  20 mg Per Tube Daily  . sodium chloride  10-40 mL Intracatheter Q12H  . vancomycin  250 mg Oral 4 times per day    Continuous Infusions: . sodium chloride 10 mL/hr at 07/14/14 0800  . feeding supplement (JEVITY 1.2 CAL) 1,000 mL (07/22/14 2300)    Kallie Locks, MS, RD, LDN Pager # [phone] After hours/ weekend pager # [phone]

## 2014-07-24 NOTE — Progress Notes (Signed)
PT Cancellation Note  Patient Details Name: Kathryn Booth MRN: [id number] DOB: 03/18/1971   Cancelled Treatment:    Reason Eval/Treat Not Completed:  (spouse not available for education at this time. )   Fabio AsaWerner, Sakura Denis J 07/24/2014, 3:26 PM Charlotte Crumbevon Daimon Kean, PT DPT  [phone]

## 2014-07-25 ENCOUNTER — Inpatient Hospital Stay (HOSPITAL_COMMUNITY): Payer: Medicare Other

## 2014-07-25 LAB — BLOOD GAS, ARTERIAL
Acid-Base Excess: 4.4 mmol/L — ABNORMAL HIGH (ref 0.0–2.0)
Bicarbonate: 27.4 mEq/L — ABNORMAL HIGH (ref 20.0–24.0)
Drawn by: 249101
FIO2: 0.21 %
LHR: 15 {breaths}/min
O2 SAT: 95.6 %
PCO2 ART: 33.7 mmHg — AB (ref 35.0–45.0)
PEEP/CPAP: 5 cmH2O
Patient temperature: 98.6
Pressure support: 10 cmH2O
TCO2: 28.5 mmol/L (ref 0–100)
VT: 450 mL
pH, Arterial: 7.521 — ABNORMAL HIGH (ref 7.350–7.450)
pO2, Arterial: 76.2 mmHg — ABNORMAL LOW (ref 80.0–100.0)

## 2014-07-25 LAB — CBC WITH DIFFERENTIAL/PLATELET
Basophils Absolute: 0 10*3/uL (ref 0.0–0.1)
Basophils Relative: 0 % (ref 0–1)
EOS ABS: 0.1 10*3/uL (ref 0.0–0.7)
EOS PCT: 0 % (ref 0–5)
HCT: 31 % — ABNORMAL LOW (ref 36.0–46.0)
Hemoglobin: 10 g/dL — ABNORMAL LOW (ref 12.0–15.0)
Lymphocytes Relative: 17 % (ref 12–46)
Lymphs Abs: 1.9 10*3/uL (ref 0.7–4.0)
MCH: 29.9 pg (ref 26.0–34.0)
MCHC: 32.3 g/dL (ref 30.0–36.0)
MCV: 92.8 fL (ref 78.0–100.0)
Monocytes Absolute: 0.8 10*3/uL (ref 0.1–1.0)
Monocytes Relative: 7 % (ref 3–12)
NEUTROS ABS: 8.5 10*3/uL — AB (ref 1.7–7.7)
Neutrophils Relative %: 76 % (ref 43–77)
Platelets: 202 10*3/uL (ref 150–400)
RBC: 3.34 MIL/uL — ABNORMAL LOW (ref 3.87–5.11)
RDW: 13.6 % (ref 11.5–15.5)
WBC: 11.3 10*3/uL — ABNORMAL HIGH (ref 4.0–10.5)

## 2014-07-25 LAB — GLUCOSE, CAPILLARY
GLUCOSE-CAPILLARY: 160 mg/dL — AB (ref 70–99)
Glucose-Capillary: 123 mg/dL — ABNORMAL HIGH (ref 70–99)
Glucose-Capillary: 118 mg/dL — ABNORMAL HIGH (ref 70–99)

## 2014-07-25 LAB — COMPREHENSIVE METABOLIC PANEL
ALBUMIN: 2.8 g/dL — AB (ref 3.5–5.2)
ALT: 67 U/L — AB (ref 0–35)
AST: 36 U/L (ref 0–37)
Alkaline Phosphatase: 63 U/L (ref 39–117)
Anion gap: 6 (ref 5–15)
BUN: 9 mg/dL (ref 6–23)
CO2: 32 mmol/L (ref 19–32)
Calcium: 9.8 mg/dL (ref 8.4–10.5)
Chloride: 105 mmol/L (ref 96–112)
Glucose, Bld: 127 mg/dL — ABNORMAL HIGH (ref 70–99)
POTASSIUM: 3.3 mmol/L — AB (ref 3.5–5.1)
Sodium: 143 mmol/L (ref 135–145)
TOTAL PROTEIN: 6.2 g/dL (ref 6.0–8.3)
Total Bilirubin: 0.4 mg/dL (ref 0.3–1.2)

## 2014-07-25 MED ORDER — POTASSIUM CHLORIDE 20 MEQ/15ML (10%) PO SOLN
40.0000 meq | Freq: Once | ORAL | Status: AC
  Administered 2014-07-25: 40 meq
  Filled 2014-07-25: qty 30

## 2014-07-25 NOTE — Progress Notes (Signed)
Patient placed on home vent at same settings.  No complications noted.  Patient is on room air while on the vent.  RT will continue to monitor.

## 2014-07-25 NOTE — Progress Notes (Signed)
Speech Language Pathology Treatment: Cognitive-Linquistic  Patient Details Name: Domingo SepDana Lashon Pickron MRN: [id number] DOB: 1970-11-06 Today's Date: 07/25/2014 Time: [id number] SLP Time Calculation (min) (ACUTE ONLY): 31 min  Assessment / Plan / Recommendation Clinical Impression  Pt successfully used eye gaze to respond to yes/no questions. Pt responded to 20 questions with 85% accuracy when given max verbal and visual cues. Used topic board to ask pt questions with minimal success. Pt response was inconsistent. Pt can shake head and provide communication partner with a clear no. On occassions, pt's eye gaze shifted away from answer choices and an accurate response was unclear. Called pt's husband and discussed therapy session. Recommended pt's husband use list of questions provided to determine wife's wants and needs. RN reports she has not used eye gaze while in room with pt. Discussed follow up care with case manager regarding home health.    HPI HPI: 44 y.o. 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 Chronic trach and vent. Chronic Encephalopathy: etiology unclear-> hyperthermia vs hypoperfusion vs ongoing infection. Has had EEGs that are NOT c/w being locked in.  Recently admitted to [facility] (06/07/14). Has had persistent tachycardia while at [facility]. Brought to [facility] ED 12/21 after husbands request for further evaluation of tachycardia. 01/07 Blood culture from 01/03 positive for ESBL and carbapenem resistant Klebsiella. ID consult. AVYCAZ initiated (ceftaz-avibactam).     Pertinent Vitals    SLP Plan  Continue with current plan of care    Recommendations                Plan: Continue with current plan of care    GO     Bermudez-Bosch, Heike Pounds 07/25/2014, 2:10 PM

## 2014-07-25 NOTE — Progress Notes (Signed)
Deep oral sxn done. Trach change to #6XLT Proximal done by Dr Tyson AliasFeinstein with RT at the bedside. Good color change on CO@ detector.  BBS confirmed RH.  Placed pt back on vent with no complications with same settings. Vent chk and deep oral sxn and trach sxn done by RT. Pt tol well.

## 2014-07-25 NOTE — Procedures (Signed)
  Trach change  6 xlt Flexible bouche placed Trach removed Trach replaced Within 30 secs desat and some associated brady all self resolved in 30 seconds of new trach placed Cap good, equal BS, Tv returns good She cannot go home without O2  Kathryn Booth AliasFeinstein, MD, FACP Pgr: [phone] [facility]

## 2014-07-25 NOTE — Progress Notes (Signed)
PT Cancellation Note  Patient Details Name: Kathryn Booth MRN: [id number] DOB: [birth year]   Cancelled Treatment:    Reason Eval/Treat Not Completed:  (spouse not available for education at this time. ) Attempted again this day, spouse again not available will attempt later this afternoon.   Fabio AsaWerner, [name] J 07/25/2014, 11:49 AM [name], PT DPT  [phone]

## 2014-07-25 NOTE — Progress Notes (Addendum)
PULMONARY / CRITICAL CARE MEDICINE   Name: Kathryn Booth MRN: [id number] DOB: 1970-10-26    ADMISSION DATE:  06/19/2014 CONSULTATION DATE:  07/25/2014  REFERRING MD :  [facility]  CHIEF COMPLAINT:  Tachycardia  INITIAL PRESENTATION:   16 yobf  who is quadriplegic due to cervical abscess s/p debridement 04/10/14.   admitted to [facility] 06/07/14.  Has had persistent tachycardia while at [facility].  Brought to [facility] ED 12/21 after husbands request for further evaluation of tachycardia.  In ED, UA suggestive of UTI.  STUDIES/SIGNIFICANT EVENTS: 10/12  Debridement of cervical abscess [facility]) 12/09  Admitted to [facility] 12/21  Admitted to [facility] with hx as above 12/28 Husband definitely wants to take patient home. Care manager says things are being set up. Temp  102.30F -- ongoing fevers 101-103F. Controlled wit tylenol.  01/06 Labile fevers 01/07 Blood culture from 01/03 positive for ESBL and carbapenem resistant Klebsiella. ID consult. AVYCAZ initiated (ceftaz-avibactam)  STUDIES: 12/28 CT chest: Dense left lower lobe collapse/ consolidation, suggestive of pneumonia. There is patchy less confluent airspace disease in the left upper lobe.  INDWELLING DEVICES:: Chronic trach  RUE PICC (from [facility])  >> 1/03 LUE PICC 1/03 >> 1/12 Mid line PICC 1/12 >>  MICRO DATA: Resp 12/22 >> Serratia (pansens) Blood 12/22 >> 1/2 enterococcus C diff 12/29 >> POSITIVE Blood 01/03 >> 1/2 ESBL, carbapenem-resistent Klebsiella Blood 1/08 >  Neg x 2 PICC Cath tip 1/12 >  NEG    CURRENT ANTIMICROBIALS:  Dicloxacillin (for chronic MSSA osteo) >> 08/14/14 (planned) Vanc 12/24 >> 01/05 Cipro 12/21 >> 12/26, 01/03 >> 01/07 Enteral vanc 12/28 >> 1/11 Ceftaz 01/04 >> 01/07 Ceftaz-avibactam (AVYCAZ) 01/07 >>   SUBJECTIVE: Good communication via eye movements with ST.   VITAL SIGNS: Temp:  [97.1 F (36.2 C)-98.5 F (36.9 C)] 98.3 F (36.8 C) (01/26 1200) Pulse Rate:  [66-81] 72 (01/26 1200) Resp:  [15-29] 22  (01/26 1200) BP: (87-151)/(40-76) 120/66 mmHg (01/26 1200) SpO2:  [96 %-100 %] 98 % (01/26 1200) FiO2 (%):  [21 %] 21 % (01/26 1200)   VENTILATOR SETTINGS: Vent Mode:  [-] SIMV/PC/PS FiO2 (%):  [21 %] 21 % Set Rate:  [15 bmp] 15 bmp Vt Set:  [450 mL] 450 mL PEEP:  [5 cmH20] 5 cmH20 Pressure Support:  [10 cmH20] 10 cmH20 Plateau Pressure:  [13 cmH20] 13 cmH20   INTAKE / OUTPUT: Intake/Output      01/25 0701 - 01/26 0700 01/26 0701 - 01/27 0700   I.V. (mL/kg) 120 (1.5) 60 (0.7)   Other  120   NG/GT 1530 275   IV Piggyback 100    Total Intake(mL/kg) 1750 (21.4) 455 (5.6)   Urine (mL/kg/hr) 1450 (0.7) 400 (0.7)   Total Output 1450 400   Net +300 +55          PHYSICAL EXAMINATION: General: eyes open, communicating well with eyes Neuro:     non-communicative to verbal  HEENT: WNL, trach in place Cardiovascular: RRR, reg, 3/6 systolic M no changes Lungs: clear anteriorly Abdomen: soft, non tender, PEG site clean, normoactive BS Ext: no edema, no cyanosis   Recent Labs Lab 07/19/14 0432 07/25/14 0500  NA 142 143  K 3.3* 3.3*  CL 102 105  CO2 29 32  BUN 9 9  CREATININE <0.30* <0.30*  GLUCOSE 130* 127*    Recent Labs Lab 07/19/14 0432 07/25/14 0500  HGB 10.0* 10.0*  HCT 31.9* 31.0*  WBC 9.9 11.3*  PLT 237 202   CXR No  results found.   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  Plan:   Continue full vent support, cycling on PSV  As tolerates NO PS planned at home Place on home vent today and assess ABG Hope to DC home tomorrow 1/27  Klebsiella bacteremia - highly resistant (ESBL and carbapenemase producing). Plan:  Avycaz (ceftaz-avibactam) 2.5g IV q8h; per ID >> planned d/c date 07/25/14 Last dose today in PM will need ID follow up  C diff Colitis.  Plan: H2 blocker  (Pepcid) and avoid PPI with cdiff risk  Sepsis Sinus tachycardia, controlled. Chronic hypotension, controlled on midodrine. Plan:  Continue lopressor and midodrine  Consider dc tele after home vent assessed  Chronic cervical osteomyelitis/diskitis. Plan: On dicloxacillin through 08/14/14  Hypokalemia. Plan F/u and replace as needed bmet in am needed  Protein calorie malnutrition.  Plan Continue tube feeds   Quadriplegic/ physical deconditioning. Hx of anxiety/ depression Plan Continue supportive care PT and speech following- appreciated, communicates well with eyes Fentanyl patch 50 mcg on 1/15  Anemia of chronic disease. Plan: F/u CBC intermittently, am planned lovenox for DVT prophylaxis  Summary: Will assess home vent today If tolerates can hopefully DC to home tomorrow 1/27   Joneen RoachPaul Hoffman, AGACNP-BC [facility] Pager [phone] or [phone]  STAFF NOTE: Cindi CarbonI, [name], MD FACP have personally reviewed patient's available data, including medical history, events of note, physical examination and test results as part of my evaluation. I have discussed with resident/NP and other care providers such as pharmacist, RN and RRT. In addition, I personally evaluated patient and elicited key findings of: still await home vent and abg in am to assess MV needs, needs to finish ABX course tonight, have spoken to RT about ABG required today, k supp then repeat lytes in am   Mcarthur Rossettianiel J. Tyson AliasFeinstein, MD, FACP Pgr: [phone] [facility] 07/25/2014 2:31 PM

## 2014-07-26 LAB — BLOOD GAS, ARTERIAL
Acid-Base Excess: 1.4 mmol/L (ref 0.0–2.0)
Bicarbonate: 25.1 mEq/L — ABNORMAL HIGH (ref 20.0–24.0)
Drawn by: 31996
FIO2: 0.21 %
O2 Saturation: 92.8 %
PEEP: 5 cmH2O
Patient temperature: 98.6
Pressure support: 10 cmH2O
RATE: 12 resp/min
TCO2: 26.3 mmol/L (ref 0–100)
VT: 450 mL
pCO2 arterial: 37.6 mmHg (ref 35.0–45.0)
pH, Arterial: 7.441 (ref 7.350–7.450)
pO2, Arterial: 73.2 mmHg — ABNORMAL LOW (ref 80.0–100.0)

## 2014-07-26 LAB — COMPREHENSIVE METABOLIC PANEL
ALT: 65 U/L — AB (ref 0–35)
AST: 37 U/L (ref 0–37)
Albumin: 2.7 g/dL — ABNORMAL LOW (ref 3.5–5.2)
Alkaline Phosphatase: 61 U/L (ref 39–117)
Anion gap: 10 (ref 5–15)
BUN: 7 mg/dL (ref 6–23)
CO2: 26 mmol/L (ref 19–32)
Calcium: 9.1 mg/dL (ref 8.4–10.5)
Chloride: 103 mmol/L (ref 96–112)
Glucose, Bld: 138 mg/dL — ABNORMAL HIGH (ref 70–99)
POTASSIUM: 3.6 mmol/L (ref 3.5–5.1)
Sodium: 139 mmol/L (ref 135–145)
TOTAL PROTEIN: 6.2 g/dL (ref 6.0–8.3)
Total Bilirubin: 0.6 mg/dL (ref 0.3–1.2)

## 2014-07-26 LAB — GLUCOSE, CAPILLARY
Glucose-Capillary: 112 mg/dL — ABNORMAL HIGH (ref 70–99)
Glucose-Capillary: 118 mg/dL — ABNORMAL HIGH (ref 70–99)
Glucose-Capillary: 136 mg/dL — ABNORMAL HIGH (ref 70–99)

## 2014-07-26 MED ORDER — PAROXETINE HCL 20 MG PO TABS: 20.0000 mg | ORAL_TABLET | Freq: Every day | ORAL | Status: AC

## 2014-07-26 MED ORDER — PRO-STAT SUGAR FREE PO LIQD
30.0000 mL | Freq: Once | ORAL | Status: AC
  Administered 2014-07-26: 30 mL
  Filled 2014-07-26: qty 30

## 2014-07-26 MED ORDER — VANCOMYCIN 50 MG/ML ORAL SOLUTION: 250.0000 mg | Freq: Four times a day (QID) | ORAL | Status: DC

## 2014-07-26 NOTE — Discharge Summary (Addendum)
Physician Discharge Summary  Patient ID: Kathryn Booth MRN: [id number] DOB/AGE: 1970-10-14 44 y.o.  Admit date: 06/19/2014 Discharge date: 07/26/2014  Problem List Active Problems:   UTI (lower urinary tract infection)   Respiratory failure   Sinus tachycardia   Pneumonia due to Serratia marcescens   Quadriplegia   Enterococcal bacteremia   Acute respiratory failure   Atelectasis of left lung   Chronic respiratory failure   Hypokalemia   Pneumonia   Carbapenem-resistant Klebsiella pneumoniae infection   Gram-negative bacteremia   Severe sepsis   Clostridium difficile colitis   Fever   HCAP (healthcare-associated pneumonia)  HPI: Pt is encephalopathic; therefore, this HPI is obtained from chart review. Kathryn Booth is a 44 y.o. F with PMH as outlined below. She was admitted to [facility] 10/12 with complaints of neck pain and was found to have cervical abscess for which she underwent debridement on 10/15. She is now quadriplegic secondary to her cervical abscess. Her hospital course was complicated by development of shock and multiple organ failure requiring pressors. She was discharged to [facility] for attempts at weaning from ventilator; however, she was unable to do so secondary to quadriplegia. She subsequently required trach and PEG She was admitted to [facility] on 06/07/14 and per report, had been tachycardic since then. On 12/21, pt's husband requested that she be sent to [facility] and admitted for further evaluation of tachycardia. Hospital Course:  INITIAL PRESENTATION:  32 yobf who is quadriplegic due to cervical abscess s/p debridement 04/10/14. admitted to [facility] 06/07/14. Has had persistent tachycardia while at [facility]. Brought to [facility] ED 12/21 after husbands request for further evaluation of tachycardia. In ED, UA suggestive of UTI.  STUDIES/SIGNIFICANT EVENTS: 10/12 Debridement of cervical abscess [facility]) 12/09 Admitted to [facility] 12/21  Admitted to [facility] with hx as above 12/28 Husband definitely wants to take patient home. Care manager says things are being set up. Temp 102.82F -- ongoing fevers 101-103F. Controlled wit tylenol.  01/06 Labile fevers 01/07 Blood culture from 01/03 positive for ESBL and carbapenem resistant Klebsiella. ID consult. AVYCAZ initiated (ceftaz-avibactam)  STUDIES: 12/28 CT chest: Dense left lower lobe collapse/ consolidation, suggestive of pneumonia. There is patchy less confluent airspace disease in the left upper lobe.  INDWELLING DEVICES:: Chronic trach  RUE PICC (from [facility]) >> 1/03 LUE PICC 1/03 >> 1/12 Mid line PICC 1/12 >>1/27  MICRO DATA: Resp 12/22 >> Serratia (pansens) Blood 12/22 >> 1/2 enterococcus C diff 12/29 >> POSITIVE Blood 01/03 >> 1/2 ESBL, carbapenem-resistent Klebsiella Blood 1/08 > Neg x 2 PICC Cath tip 1/12 > NEG   CURRENT ANTIMICROBIALS:  Dicloxacillin (for chronic MSSA osteo) >> 08/14/14 (planned) Vanc 12/24 >> 01/05 Cipro 12/21 >> 12/26, 01/03 >> 01/07 Enteral vanc 12/28 >> 08/08/14( planned) Ceftaz 01/04 >> 01/07 Ceftaz-avibactam (AVYCAZ) 01/07 >> 1/26    ASSESSMENT / PLAN:  Chronic VDRF; 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  Plan:  Dc with home vent FIO2 21% Ps 15 Rate 14 I time of 1 Vt 450  Klebsiella bacteremia - highly resistant (ESBL and carbapenemase producing). Plan:  Avycaz (ceftaz-avibactam) 2.5g IV q8h; per ID >>  d/c date 07/25/14 Last dose 12/26 will need ID follow up in future and will always have infection.  C diff Colitis.  Plan: H2 blocker (Pepcid) and avoid PPI with cdiff risk Continue oral van for 10 more days.  Sepsis Sinus tachycardia, controlled. Chronic hypotension, controlled on midodrine. Plan:  Continue lopressor and midodrine  Consider  dc tele after home vent assessed  Chronic cervical osteomyelitis/diskitis. Plan: On dicloxacillin through  08/14/14  Hypokalemia.  Recent Labs Lab 07/25/14 0500 07/26/14 0540  K 3.3* 3.6     Plan F/u and replace as needed bmet in am needed  Protein calorie malnutrition.  Plan Continue tube feeds   Quadriplegic/ physical deconditioning. Hx of anxiety/ depression Plan Continue supportive care PT and speech following- appreciated, communicates well with eyes Fentanyl patch 50 mcg on 1/15  Anemia of chronic disease. Plan: F/u CBC intermittently, am planned   Summary: DC home on home vent today.    Labs at discharge Lab Results  Component Value Date   CREATININE <0.30* 07/26/2014   BUN 7 07/26/2014   NA 139 07/26/2014   K 3.6 07/26/2014   CL 103 07/26/2014   CO2 26 07/26/2014   Lab Results  Component Value Date   WBC 11.3* 07/25/2014   HGB 10.0* 07/25/2014   HCT 31.0* 07/25/2014   MCV 92.8 07/25/2014   PLT 202 07/25/2014   Lab Results  Component Value Date   ALT 65* 07/26/2014   AST 37 07/26/2014   ALKPHOS 61 07/26/2014   BILITOT 0.6 07/26/2014   No results found for: INR, PROTIME  Current radiology studies Dg Chest Port 1 View  07/25/2014   CLINICAL DATA:  Congestion, pneumonia, evaluate tracheostomy tube  EXAM: PORTABLE CHEST - 1 VIEW  COMPARISON:  07/15/2014  FINDINGS: Tracheostomy tube with the tip 2.5 cm above the carina. Left pulmonary artery stent is noted. Right-sided PICC line with the tip projecting over the cavoatrial junction.  Bilateral mild interstitial thickening. Patchy right perihilar and right lower lobe airspace disease. No pleural effusion or pneumothorax. Stable cardiomegaly. No acute osseous abnormality.  IMPRESSION: 1. Tracheostomy tube with the tip 2.5 cm above the carina. 2. Mild bilateral interstitial thickening as well as right perihilar and right lower lobe airspace disease. The findings are concerning for multilobar pneumonia versus asymmetric pulmonary edema.   Electronically Signed   By: Kathreen Devoid   On: 07/25/2014 17:29     Disposition:  Final discharge disposition not confirmed  Discharge Instructions    Discharge patient    Complete by:  As directed      Face-to-face encounter (required for Medicare/Medicaid patients)    Complete by:  As directed   I MINOR, Gwyndolyn Saxon certify that this patient is under my care and that I, or a nurse practitioner or physician's assistant working with me, had a face-to-face encounter that meets the physician face-to-face encounter requirements with this patient on 07/26/2014. The encounter with the patient was in whole, or in part for the following medical condition(s) which is the primary reason for home health care (List medical condition): quadriplegia  The encounter with the patient was in whole, or in part, for the following medical condition, which is the primary reason for home health care:  quadaplegia  I certify that, based on my findings, the following services are medically necessary home health services:   Nursing Speech language pathology    Reason for Medically Necessary Home Health Services:  Skilled Nursing- Change/Decline in Patient Status  My clinical findings support the need for the above services:  Bedbound  Further, I certify that my clinical findings support that this patient is homebound due to:  Unable to leave home safely without assistance     Home Health    Complete by:  As directed   To provide the following care/treatments:   PT SLP  Medication List    STOP taking these medications        dicloxacillin 500 MG capsule  Commonly known as:  DYNAPEN     heparin 5000 UNIT/ML injection     potassium chloride 20 MEQ packet  Commonly known as:  KLOR-CON     senna 8.6 MG tablet  Commonly known as:  SENOKOT      TAKE these medications        acetaminophen 325 MG tablet  Commonly known as:  TYLENOL  650 mg by PEG Tube route every 6 (six) hours as needed for mild pain.     chlorhexidine 0.12 % solution  Commonly known as:   PERIDEX  Use as directed 15 mLs in the mouth or throat 2 (two) times daily.     diltiazem 30 MG tablet  Commonly known as:  CARDIZEM  Place 30 mg into feeding tube every 6 (six) hours as needed (> 120).     famotidine 20 MG tablet  Commonly known as:  PEPCID  20 mg by PEG Tube route at bedtime.     fentaNYL 25 MCG/HR patch  Commonly known as:  DURAGESIC - dosed mcg/hr  Place 25 mcg onto the skin every 3 (three) days.     HUMALOG 100 UNIT/ML injection  Generic drug:  insulin lispro  Inject 0-8 Units into the skin 3 (three) times daily before meals. Patient uses sliding scale     ISOSOURCE 1.5 CAL PO  300 mLs by PEG Tube route every 6 (six) hours.     metoprolol tartrate 25 MG tablet  Commonly known as:  LOPRESSOR  25 mg by PEG Tube route every 8 (eight) hours.     midodrine 10 MG tablet  Commonly known as:  PROAMATINE  10 mg by PEG Tube route 3 (three) times daily.     PARoxetine 20 MG tablet  Commonly known as:  PAXIL  Place 1 tablet (20 mg total) into feeding tube daily.     polyvinyl alcohol 1.4 % ophthalmic solution  Commonly known as:  LIQUIFILM TEARS  Place 1 drop into both eyes as needed for dry eyes.     vancomycin 50 mg/mL oral solution  Commonly known as:  VANCOCIN  Take 5 mLs (250 mg total) by mouth every 6 (six) hours.          Discharged Condition: poor  Time spent on discharge greater than 40 minutes.  Vital signs at Discharge. Temp:  [98 F (36.7 C)-99.2 F (37.3 C)] 99.1 F (37.3 C) (01/27 0833) Pulse Rate:  [69-92] 88 (01/27 0929) Resp:  [14-25] 20 (01/27 0833) BP: (91-139)/(42-74) 120/61 mmHg (01/27 0929) SpO2:  [96 %-100 %] 99 % (01/27 0833) FiO2 (%):  [21 %] 21 % (01/27 0725) Weight:  [187 lb (84.823 kg)] 187 lb (84.823 kg) (01/27 0500) Office follow up Special Information or instructions. She will not follow up with PCCM. She will be followed by Dr. Lorn Junes of [facility] internal medicine. Signed: Richardson Landry Minor ACNP Maryanna Shape  PCCM Pager [phone] till 3 pm If no answer page [phone] 07/26/2014, 11:38 AM     STAFF NOTE: I, Merrie Roof, MD FACP have personally reviewed patient's available data, including medical history, events of note, physical examination and test results as part of my evaluation. I have discussed with resident/NP and other care providers such as pharmacist, RN and RRT. In addition, I personally evaluated patient and elicited key findings of:  Mild ronchi, no changes in prior  neurostatus, husband is aware of high risk declines from home transfer vs to SNF, needs O2, trach is new, ABX per ID, dc picc, treat cdiff further, ph alk, reduce MV  Lavon Paganini. Titus Mould, MD, [place] Pgr: [facility] 07/26/2014 2:05 PM

## 2014-07-26 NOTE — Progress Notes (Signed)
SATURATION QUALIFICATIONS: (This note is used to comply with regulatory documentation for home oxygen)  Patient Saturations on Room Air at Rest = 50%  Patient Saturations on Room Air while Ambulating = %  Patient Saturations on Liters of oxygen while Ambulating = %  Please briefly explain why patient needs home oxygen:

## 2014-07-26 NOTE — Progress Notes (Signed)
Physical Therapy Treatment Patient Details Name: Adreanne Yono MRN: [id number] DOB: [birth year] Today's Date: August 09, 2014    History of Present Illness 44 y.o. 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  Recently admitted to [facility] (06/07/14).  Has had persistent tachycardia while at [facility].  Brought to [facility] ED 12/21 after husbands request for further evaluation of tachycardia.  In ED, UA suggestive of UTI.    PT Comments    Patient spouse reviewed handouts for home positioning as well as PROM. Spouse demonstrates hand PROM and reviewed verbally PROM for all other extremities. Spouse able to verbally recall instructions of positioning from previous session. No further questions or concerns re: home management at this time. Will defer further PT to HHPT.   Follow Up Recommendations  Home health PT;Supervision/Assistance - 24 hour (plan is for home dc per husband)     [facility] bed;Other (comment) (air mattress, hoyer lift, reclining w/c)    Recommendations for Other Services       Precautions / Restrictions Precautions Precautions:  (Vent) Restrictions Weight Bearing Restrictions: No    Mobility  Bed Mobility                  Transfers                    Ambulation/Gait                 Stairs            Wheelchair Mobility    Modified Rankin (Stroke Patients Only)       Balance                                    Cognition Arousal/Alertness: Awake/alert   Overall Cognitive Status: Impaired/Different from baseline                      Exercises      General Comments General comments (skin integrity, edema, etc.): PROM all extremities, Repositioning for skin protection. Reviewed Home PROM program and hand outs with spouse. No further questions at this time.      Pertinent Vitals/Pain Faces Pain Scale: No hurt    Home Living                       Prior Function            PT Goals (current goals can now be found in the care plan section) Acute Rehab PT Goals PT Goal Formulation: With family Time For Goal Achievement: 08-09-2014 Potential to Achieve Goals: Fair Progress towards PT goals: Goals met/education completed, patient discharged from PT    Frequency  Min 1X/week    PT Plan Current plan remains appropriate    Co-evaluation             End of Session Equipment Utilized During Treatment:  (ventilator) Activity Tolerance: Patient limited by fatigue Patient left: in bed;with call bell/phone within reach;with family/visitor present     Time: 4270-6237 PT Time Calculation (min) (ACUTE ONLY): 18 min  Charges:  $Self Care/Home Management: 03-05-23                    G CodesDuncan Dull 08-09-14, 9:18 AM Alben Deeds, PT DPT  [phone]

## 2014-07-26 NOTE — Progress Notes (Signed)
[place] 3 [place] but ambulance called Henrietta RN case manager who set transportation up and said they had an emergency and would be later. Two hours later nurse contacted ambulance as they still had not showed up. Ambulance stated they could not come until approximately 7 pm.   Pt was cleaned up and discharge gown placed on pt by 3 pm. IV team was to floor at 1430 and removed PICC line to right arm without difficulty or complications and pt tolerated well. Pt's medical supplies packed in bags. Respiratory completed an ABG and changed vent settings and pt tolerating well. Vitals within normal limits. Medications given and monitoring until ambulance arrives. Later ambulance had not come therefore more vitals and medications given for day and caught up until 7 pm.   Pt's husband [name] was in room before 9 am but had nurse call guest services to come and take him to his car in wheelchair as he stated he had to leave to get home and get things prepared before his wife came by ambulance. Case manager had spoken to him many times about preparing for home and home health services etc as did doctors, respiratory therapists and a man for home healthcare equipment. Pt's husband was also notified by nurse and it was reinforced that if he left he would not have discharge papers, instructions and medication list to review as doctor does not have paperwork done (discharge summary) at this time and pt's husband [name] stated that was ok he had to go. Paperwork is being sent in envelope with ambulance.   Pt's husband after leaving called hospital floor several times talking to case manager and nurse and last call in evening between 1830 and 1930 was concerning his loss of cell phone and wanting nurse to look in pt's room. Nurse looked in pt's room for cell phone several times and did not find it and reported this back to [name]. At this time [name] requesting nurse  send medications with pt in ambulance and in particular pain medication. Nurse explained to [name] that when a pt is discharged to home that the pt and pt's caregiver are responsible to pick medications up at pharmacy. Also explained it is against hospital policy to send medications to home. [name] was very upset and his voice appeared angry with this information. Nurse assured him that pt has a pain patch (fentanyl) in place per md orders and is in her medication list and this should help with pt's pain. Pt has not in anyway appeared to have any signs of pain. The nurse explained that he needed to contact his home health nurse as a resource and pharmacy to assist him with this as nurse had no other means of assisting him. Notified him that all discharge paperwork would be sent with ambulance.   [facility] to notify and assist if any other problems occurred. Passing off to night nurse as ambulance has not arrived at this time. Night nurse calling ambulance to see what ETA is. Also passed on a number to night nurse to call for respiratory at [facility] to inform of pt's departure time at [phone].

## 2014-07-27 ENCOUNTER — Telehealth: Payer: Self-pay | Admitting: Pulmonary Disease

## 2014-07-27 MED ORDER — DILTIAZEM HCL 30 MG PO TABS: ORAL_TABLET | ORAL | Status: AC

## 2014-07-27 MED ORDER — METOPROLOL TARTRATE 25 MG PO TABS: 25.0000 mg | ORAL_TABLET | Freq: Three times a day (TID) | ORAL | Status: AC

## 2014-07-27 MED ORDER — FENTANYL 25 MCG/HR TD PT72: 25.0000 ug | MEDICATED_PATCH | TRANSDERMAL | Status: DC

## 2014-07-27 MED ORDER — VANCOMYCIN 50 MG/ML ORAL SOLUTION: 250.0000 mg | Freq: Four times a day (QID) | ORAL | Status: AC

## 2014-07-27 MED ORDER — MIDODRINE HCL 10 MG PO TABS: 10.0000 mg | ORAL_TABLET | Freq: Three times a day (TID) | ORAL | Status: AC

## 2014-07-27 MED ORDER — FAMOTIDINE 20 MG PO TABS: 20.0000 mg | ORAL_TABLET | Freq: Every day | ORAL | Status: AC

## 2014-07-27 NOTE — Progress Notes (Signed)
[facility], she is being discharge to private residence with [facility]. Spoke to LowryKatherine, respiratory at [facility], at [phone], she will be at the patient house awaiting their arrival. Nighttime meds were given. Discharge papers were given to transporters.    Alba DestineMisty L Ennis, RN, BSN

## 2014-07-27 NOTE — Telephone Encounter (Signed)
Spoke with [facility] and they were unable to get Vancomycin for pt.  [facility] [phone]) and they will be supplying this for pt.  They also stated that pt's husband was upset that nothing had been given for pain.  ATC pt to ask about rx given at discharge.  Unable to leave message will  Try back later.

## 2014-07-28 ENCOUNTER — Telehealth: Payer: Self-pay | Admitting: Emergency Medicine

## 2014-07-28 ENCOUNTER — Other Ambulatory Visit: Payer: Self-pay | Admitting: Emergency Medicine

## 2014-07-28 MED ORDER — FENTANYL 25 MCG/HR TD PT72: 25.0000 ug | MEDICATED_PATCH | TRANSDERMAL | Status: AC

## 2014-07-28 MED ORDER — TRAMADOL HCL 50 MG PO TABS: 100.0000 mg | ORAL_TABLET | Freq: Four times a day (QID) | ORAL | Status: AC | PRN

## 2014-07-28 NOTE — Telephone Encounter (Signed)
Notified by case manager of 2 issues with the patient's discharge prescriptions.  - husband is unable to fill script for fentanyl 25mcg patches because there is no written signature  - he is unable to fill script for vanco enteral 250mg  q6h because they do not have the elixir at the pharmacy  Attempted to send fentanyl script electronically but suspect this will not be acceptable  Gave a voice order for tramadol per tube Requested that they fill vanco capsules instead of the elixir  Levy Pupaobert Adeliz Tonkinson, MD, PhD 07/28/2014, 7:27 PM [facility] [phone] or if no answer [phone]

## 2014-07-31 NOTE — Telephone Encounter (Signed)
See phone note dated 07/28/14 

## 2015-06-13 IMAGING — CR DG CHEST 1V PORT
1 series · 1 of 1 positions shown · non-contrast
Comparison: Prior chest x-ray 07/09/2014

CLINICAL DATA: Healthcare acquired pneumonia, hypotension

EXAM:
PORTABLE CHEST - 1 VIEW

[AP]
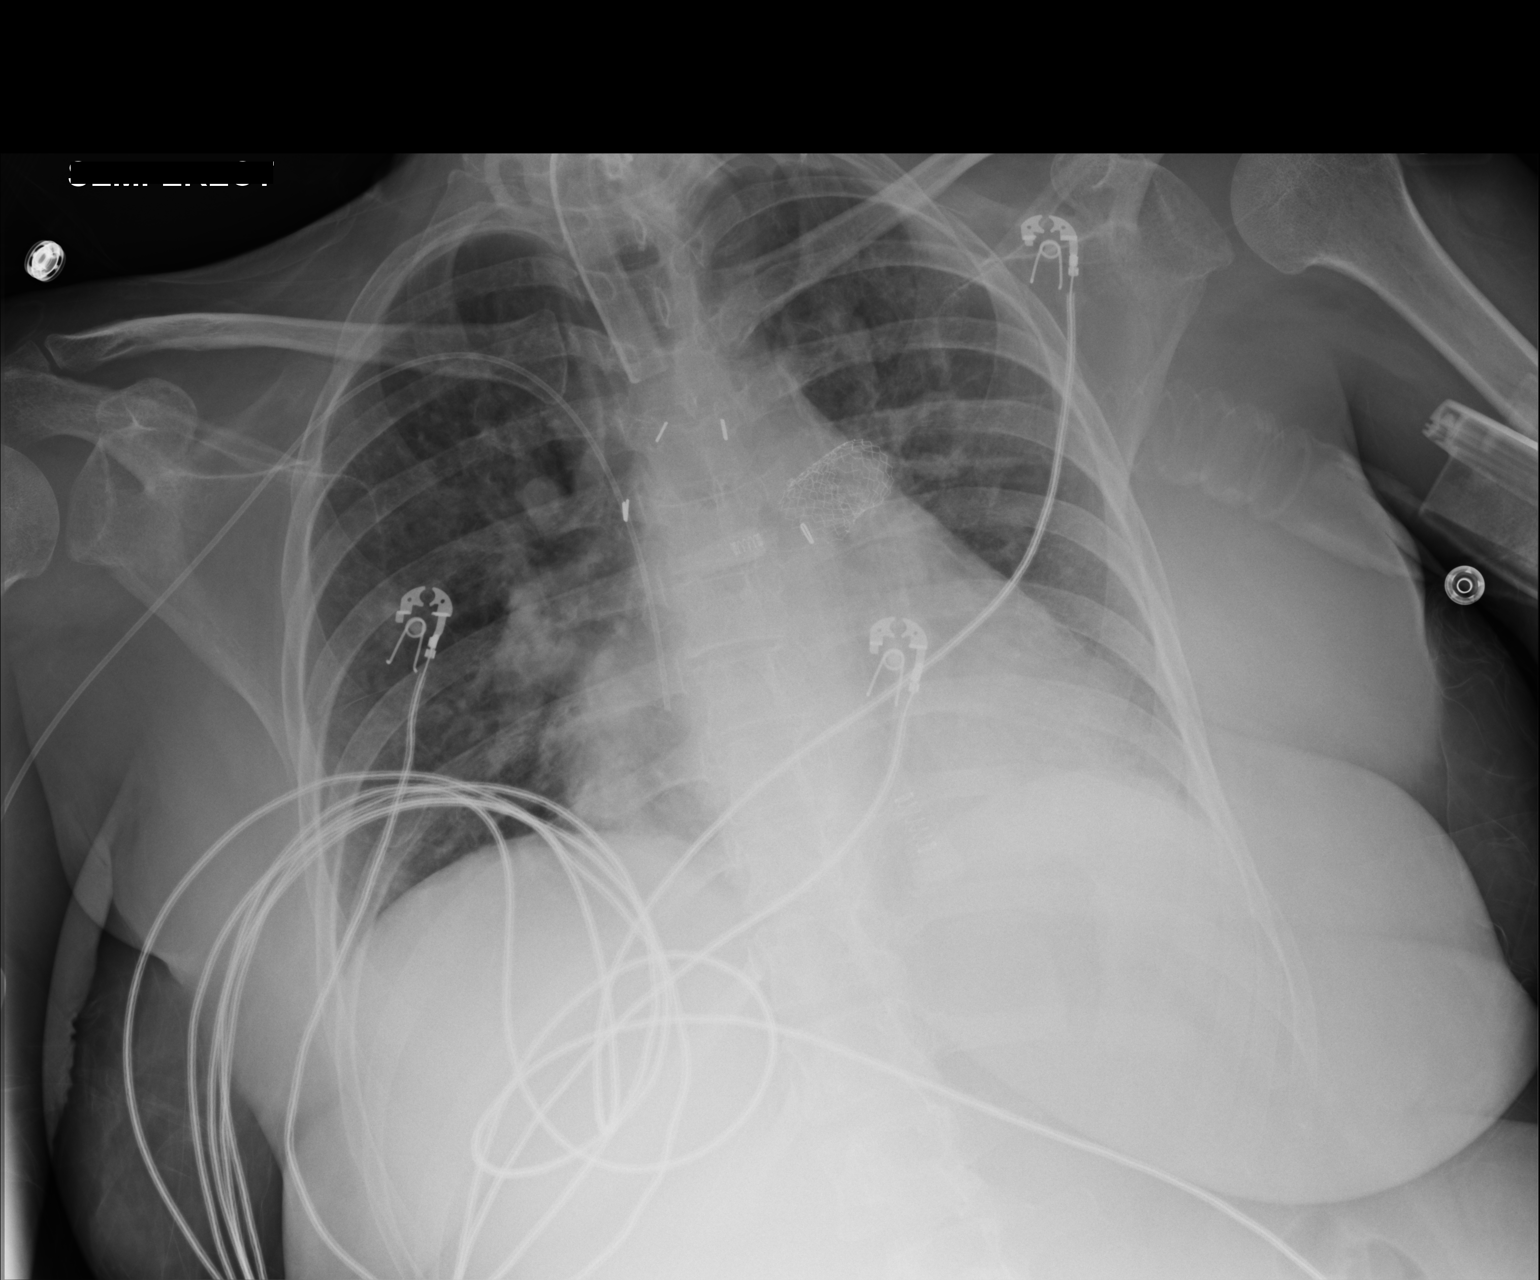

[1 of 1 positions shown; findings below may reference images not displayed]

FINDINGS: Tracheostomy tube remains in good position with the tip midline and
at the level of the clavicles. A right upper extremity PICC is a
present. The tip overlies the superior cavoatrial junction. The
previously identified left upper extremity PICC has been removed. A
balloon expandable metal stent is identified in the left main
pulmonary artery. This is been present over multiple prior studies.

Stable mild enlargement of the cardiopericardial silhouette. Mild
pulmonary vascular congestion without overt edema slightly increased
compared to prior. Nonspecific bibasilar retrocardiac opacities
favored to reflect atelectasis. No pneumothorax or other new acute
abnormality. No acute osseous abnormality.
IMPRESSION: 1. Slight interval increase in pulmonary vascular congestion but no
overt edema.
2. Interval removal of left upper extremity PICC and placement of a
right upper extremity PICC. The catheter tip overlies the superior
cavoatrial junction.
3. Stable position of tracheostomy tube.
4. Nonspecific bibasilar retrocardiac opacities are favored to
reflect atelectasis.

## 2015-06-23 IMAGING — CR DG CHEST 1V PORT
1 series · 1 of 1 positions shown · non-contrast
Comparison: 07/15/2014

CLINICAL DATA: Congestion, pneumonia, evaluate tracheostomy tube

EXAM:
PORTABLE CHEST - 1 VIEW

[AP]
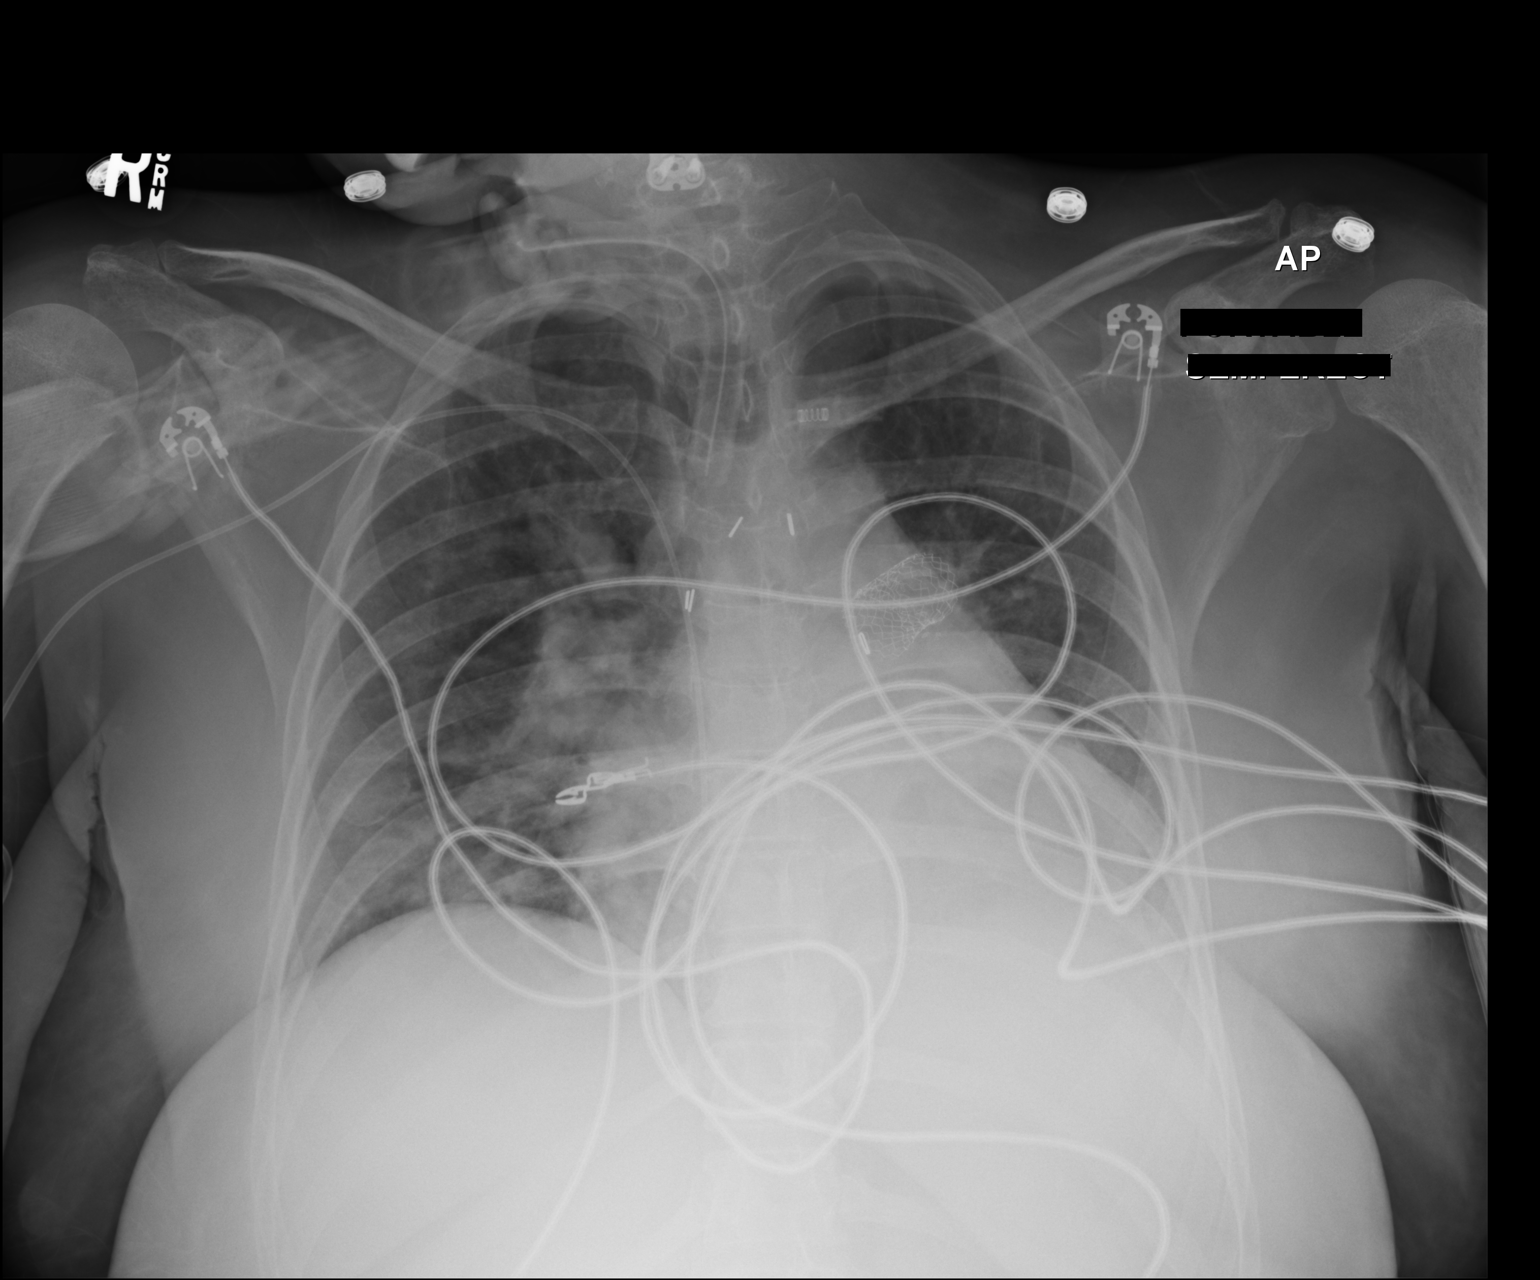

[1 of 1 positions shown; findings below may reference images not displayed]

FINDINGS: Tracheostomy tube with the tip 2.5 cm above the carina. Left
pulmonary artery stent is noted. Right-sided PICC line with the tip
projecting over the cavoatrial junction.

Bilateral mild interstitial thickening. Patchy right perihilar and
right lower lobe airspace disease. No pleural effusion or
pneumothorax. Stable cardiomegaly. No acute osseous abnormality.
IMPRESSION: 1. Tracheostomy tube with the tip 2.5 cm above the carina.
2. Mild bilateral interstitial thickening as well as right perihilar
and right lower lobe airspace disease. The findings are concerning
for multilobar pneumonia versus asymmetric pulmonary edema.

## 2016-02-29 DEATH — deceased
